# Patient Record
Sex: Female | Born: 1965 | Race: White | Hispanic: No | Marital: Married | Smoking: Never smoker
Health system: Southern US, Community
[De-identification: ages and names within clinical notes are randomized; demographics above are authoritative.]

## PROBLEM LIST (undated history)

## (undated) DIAGNOSIS — I499 Cardiac arrhythmia, unspecified: Secondary | ICD-10-CM

## (undated) DIAGNOSIS — Z9889 Other specified postprocedural states: Secondary | ICD-10-CM

## (undated) DIAGNOSIS — D352 Benign neoplasm of pituitary gland: Secondary | ICD-10-CM

## (undated) DIAGNOSIS — D649 Anemia, unspecified: Secondary | ICD-10-CM

## (undated) DIAGNOSIS — R112 Nausea with vomiting, unspecified: Secondary | ICD-10-CM

## (undated) HISTORY — DX: Benign neoplasm of pituitary gland: D35.2

## (undated) HISTORY — PX: ECTOPIC PREGNANCY SURGERY: SHX613

## (undated) HISTORY — PX: WISDOM TOOTH EXTRACTION: SHX21

---

## 1998-12-13 ENCOUNTER — Other Ambulatory Visit: Admission: RE | Admit: 1998-12-13 | Discharge: 1998-12-13 | Payer: Self-pay | Admitting: Obstetrics & Gynecology

## 2000-09-29 ENCOUNTER — Other Ambulatory Visit: Admission: RE | Admit: 2000-09-29 | Discharge: 2000-09-29 | Payer: Self-pay | Admitting: Obstetrics & Gynecology

## 2001-10-30 ENCOUNTER — Other Ambulatory Visit: Admission: RE | Admit: 2001-10-30 | Discharge: 2001-10-30 | Payer: Self-pay | Admitting: Obstetrics & Gynecology

## 2003-05-10 ENCOUNTER — Other Ambulatory Visit: Admission: RE | Admit: 2003-05-10 | Discharge: 2003-05-10 | Payer: Self-pay | Admitting: Obstetrics & Gynecology

## 2004-07-25 ENCOUNTER — Other Ambulatory Visit: Admission: RE | Admit: 2004-07-25 | Discharge: 2004-07-25 | Payer: Self-pay | Admitting: Obstetrics & Gynecology

## 2005-07-17 ENCOUNTER — Inpatient Hospital Stay (HOSPITAL_COMMUNITY): Admission: AD | Admit: 2005-07-17 | Discharge: 2005-07-19 | Payer: Self-pay | Admitting: Obstetrics & Gynecology

## 2005-07-18 ENCOUNTER — Encounter (INDEPENDENT_AMBULATORY_CARE_PROVIDER_SITE_OTHER): Payer: Self-pay | Admitting: *Deleted

## 2005-08-27 ENCOUNTER — Other Ambulatory Visit: Admission: RE | Admit: 2005-08-27 | Discharge: 2005-08-27 | Payer: Self-pay | Admitting: Obstetrics & Gynecology

## 2006-09-13 ENCOUNTER — Emergency Department (HOSPITAL_COMMUNITY): Admission: EM | Admit: 2006-09-13 | Discharge: 2006-09-13 | Payer: Self-pay | Admitting: Emergency Medicine

## 2011-02-01 NOTE — Discharge Summary (Signed)
Kim, Guerra               ACCOUNT NO.:  [id number]   MEDICAL RECORD NO.:  [id number]          PATIENT TYPE:  INP   LOCATION:  9318                          FACILITY:  [facility]   PHYSICIAN:  Freddy Finner, M.D.   DATE OF BIRTH:  Apr 06, 1966   DATE OF ADMISSION:  07/17/2005  DATE OF DISCHARGE:  07/19/2005                                 DISCHARGE SUMMARY   DISCHARGE DIAGNOSES:  1.  Right tubal pregnancy.  2.  Postoperative urinary retention.   OPERATIVE PROCEDURE:  Laparoscopy with right salpingectomy. Postoperative  complications none other than urinary retention.   DISPOSITION:  The patient was in satisfactory condition at the time of  discharge. She was voiding with 75 cc or less residual volume at the time of  her discharge. On the first postoperative day. She is discharged home with  Vicodin for postoperative pain; for follow-up in the office in 2 weeks or to  return with recurrence of urinary retention symptoms. She is to take a  regular diet. She is to have progressively increasing physical activity.   The patient presented at approximately 10 p.m. on July 17, 2005.  Her  clinical findings and ultrasound findings and findings all pointed to  ectopic pregnancy with rupture.   She was taken to the operating room where a right salpingectomy and  evacuation of blood from the abdomen was carried out. The procedure was  without complication. Later on the morning of the surgery her clinical  situation was stable. She was afebrile. Her hemoglobin was 10.2. Her abdomen  was soft and nontender but she was having difficulty or inability to empty  her bladder. For that reason she was given a Foley catheter and rested  overnight. By the next day she was able to void with minimal residual, her  condition was satisfactory and she was discharged home with disposition as  noted above.      Freddy Finner, M.D.  Electronically Signed     WRN/MEDQ  D:  08/29/2005  T:   08/30/2005  Job:  [id number]

## 2011-02-01 NOTE — Op Note (Signed)
Kim Guerra, Kim Guerra               ACCOUNT NO.:  [id number]   MEDICAL RECORD NO.:  [id number]          PATIENT TYPE:  INP   LOCATION:  9318                          FACILITY:  [facility]   PHYSICIAN:  Freddy Finner, M.D.   DATE OF BIRTH:  March 31, 1966   DATE OF PROCEDURE:  07/18/2005  DATE OF DISCHARGE:                                 OPERATIVE REPORT   PREOPERATIVE DIAGNOSIS:  Ectopic pregnancy.   POSTOPERATIVE DIAGNOSIS:  Right ampullary tubal pregnancy.   OPERATIVE PROCEDURE:  1.  Laparoscopy.  2.  Partial right salpingectomy.   SURGEON:  Dr. Jennette Kettle   ANESTHESIA:  General.   ESTIMATED INTRAOPERATIVE BLOOD LOSS:  Including blood in the abdomen at the  time of procedure 150 mL.   INTRAOPERATIVE COMPLICATIONS:  None.   The patient is a 45 year old with 2 living children, who had an unusual  bleeding pattern over the last month.  This was characterized by  approximately 8-10 days of bleeding starting on October 8 and again on  October 19.  She was seen in the office approximately 2 days prior to this  admission, at which time it was felt that she was having an ovulatory  bleeding.  Her symptoms were specific for the bleeding and generalized mild  lower abdominal pain.  November 1, in the evening prior to the surgery, she  [facility]ed [facility] because of increasing pain of her  abdomen.  She was subsequently referred to [facility] where evaluation  included quantitative hCG 497 and a pelvic ultrasound suggestive of ectopic  pregnancy.   She was brought to the operating room and there placed under adequate  general endotracheal anesthesia, placed in the dorsal lithotomy position  using the [place] stirrup system.  Betadine prep of abdomen, perineum, and  vagina was carried out in the standard fashion.  The bladder was evacuated  using sterile technique.  Cervix was visualized and Hulka tenaculum attached  without difficulty.  Sterile drapes were applied.  Two small  incisions were  then made in the abdomen, one at the umbilicus and one just above the  symphysis.  Through the umbilical incision, an 11 mm nonbladed disposable  trocar was introduced, and direct inspection revealed adequate placement  with no evidence of injury on entering.  Pneumoperitoneum was allowed to  accumulate with carbon dioxide gas.  A 5 mm trocar was placed through the  lower incision.  Systematic examination of the pelvic and abdominal contents  was carried out.  There was approximately 100-150 mL of blood in the pelvis.  There was a large clot extruding from the ampullary portion of the right  fallopian tube.  The left fallopian tube and ovary were normal.  The right  fallopian tube except for the dilatation distally appeared to be normal as  did the right ovary.  The uterus itself was normal.  The [place] irrigation  system was then used to clear some of the clots, tube expressed, and the  clot milked out of the distal ampullary end of the fallopian tube.  The tube  was still edematous  and swollen, and it was elected to proceed with partial  salpingectomy, and the Gyrus tripolar device was then used progressively  seal and divide pedicles to remove the distal half of the right fallopian  tube.  It was retrieved through the operating channel of the laparoscope and  is submitted for histologic examination.  Copious irrigation was then  carried out using the Ne[place]hat system, and all but 1 small clot were removed  from the abdomen.  Hemostasis was complete under irrigation and under  reduced intra-abdominal pressure.  The procedure was terminated.  Gas was  allowed to escape from the abdomen after all the irrigating solution was  removed.  The skin incisions were closed with interrupted subcuticular  sutures of 3-0 Dexon.  Steri-Strips were applied to the lower incision.  Marcaine 0.25% plain was injected into the incision sites for postoperative  analgesia.  The Hulka tenaculum was  removed.  The patient was awakened and  taken to recovery in good condition.      Freddy Finner, M.D.  Electronically Signed     WRN/MEDQ  D:  07/18/2005  T:  07/18/2005  Job:  [id number]

## 2014-10-06 ENCOUNTER — Other Ambulatory Visit: Payer: Self-pay | Admitting: Endocrinology

## 2014-10-06 DIAGNOSIS — E220 Acromegaly and pituitary gigantism: Secondary | ICD-10-CM

## 2014-10-17 ENCOUNTER — Ambulatory Visit
Admission: RE | Admit: 2014-10-17 | Discharge: 2014-10-17 | Disposition: A | Discharge status: Home / Self Care | Payer: PRIVATE HEALTH INSURANCE | Source: Ambulatory Visit | Attending: Endocrinology | Admitting: Endocrinology

## 2014-10-17 DIAGNOSIS — E220 Acromegaly and pituitary gigantism: Secondary | ICD-10-CM

## 2014-10-17 MED ORDER — GADOBENATE DIMEGLUMINE 529 MG/ML IV SOLN
9.0000 mL | Freq: Once | INTRAVENOUS | Status: AC | PRN
  Administered 2014-10-17: 9 mL via INTRAVENOUS

## 2014-11-04 ENCOUNTER — Encounter: Payer: Self-pay | Admitting: Cardiology

## 2014-11-04 ENCOUNTER — Ambulatory Visit (INDEPENDENT_AMBULATORY_CARE_PROVIDER_SITE_OTHER): Payer: PRIVATE HEALTH INSURANCE | Admitting: Cardiology

## 2014-11-04 VITALS — BP 162/110 | HR 98 | Ht 61.0 in | Wt 173.0 lb

## 2014-11-04 DIAGNOSIS — Z01812 Encounter for preprocedural laboratory examination: Secondary | ICD-10-CM

## 2014-11-04 DIAGNOSIS — R0600 Dyspnea, unspecified: Secondary | ICD-10-CM

## 2014-11-04 DIAGNOSIS — R9431 Abnormal electrocardiogram [ECG] [EKG]: Secondary | ICD-10-CM | POA: Insufficient documentation

## 2014-11-04 DIAGNOSIS — I10 Essential (primary) hypertension: Secondary | ICD-10-CM | POA: Insufficient documentation

## 2014-11-04 NOTE — Patient Instructions (Addendum)
Your physician recommends that you schedule a follow-up appointment in: as needed with Dr. Percival Spanish  We are going to order an echo, the schedulers will call you next week with a date and time to get that done

## 2014-11-04 NOTE — Progress Notes (Signed)
Cardiology Office Note   Date:  11/04/2014   ID:  Kim Guerra, DOB August 06, 1966, MRN [id number]  PCP:  No primary care provider on file.  Cardiologist:   Minus Breeding, MD   Chief Complaint  Patient presents with  . Pre-op Exam      History of Present Illness: Kim Guerra is a 49 y.o. female who presents for presents for preoperative evaluation. She has no past cardiac history. 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 She has apparently had some change in her facial appearance consistent with acromegaly. She's not had any cardiac issues in the past. However, given the association with pituitary hypersecretion and potential for cardiac issues such as cardiomyopathy she presents preoperatively. She's not been particularly active recently though she did exercise in the past. She still does some housework and climb stairs. She's not noticed any new symptoms such as  PND or orthopnea. She's not had any new palpitations, presyncope or syncope. She denies any chest pressure, neck or arm discomfort. She does have a significantly elevated blood pressure today and this is somewhat unusual.  She does get some dyspnea with exertion.   Past Medical History  Diagnosis Date  . Pituitary macroadenoma     Past Surgical History  Procedure Laterality Date  . Ectopic pregnancy surgery    . Wisdom tooth extraction       Current Outpatient Prescriptions  Medication Sig Dispense Refill  . ALPRAZolam (XANAX) 0.25 MG tablet Take 0.25 mg by mouth 2 (two) times daily as needed for anxiety.     No current facility-administered medications for this visit.    Allergies:   Review of patient's allergies indicates not on file.    Social History:  The patient  reports that she has never smoked. She does not have any smokeless tobacco history on file.   Family History:  The patient's family history includes Lung cancer (age of onset: 60) in her  brother; Lung cancer (age of onset: 61) in her father; Sarcoidosis in her sister.    ROS:  Please see the history of present illness.   Otherwise, review of systems are positive for none.   All other systems are reviewed and negative.    PHYSICAL EXAM: VS:  BP 162/110 mmHg  Pulse 98  Ht 5\' 1"  (1.549 m)  Wt 173 lb (78.472 kg)  BMI 32.70 kg/m2  LMP 10/15/2014 , BMI Body mass index is 32.7 kg/(m^2). GENERAL:  Well appearing HEENT:  Pupils equal round and reactive, fundi not visualized, oral mucosa unremarkable NECK:  No jugular venous distention, waveform within normal limits, carotid upstroke brisk and symmetric, no bruits, no thyromegaly LYMPHATICS:  No cervical, inguinal adenopathy LUNGS:  Clear to auscultation bilaterally BACK:  No CVA tenderness CHEST:  Unremarkable HEART:  PMI not displaced or sustained,S1 and S2 within normal limits, no S3, no S4, no clicks, no rubs, no murmurs ABD:  Flat, positive bowel sounds normal in frequency in pitch, no bruits, no rebound, no guarding, no midline pulsatile mass, no hepatomegaly, no splenomegaly EXT:  2 plus pulses throughout, no edema, no cyanosis no clubbing SKIN:  No rashes no nodules NEURO:  Cranial nerves II through XII grossly intact, motor grossly intact throughout PSYCH:  Cognitively intact, oriented to person place and time    EKG:  EKG is ordered today. The ekg ordered today demonstrates sinus rhythm, rightward axis, low voltage in the limb leads, poor anterior R wave  progression, no acute ST-T wave changes.     Wt Readings from Last 3 Encounters:  11/04/14 173 lb (78.472 kg)      Other studies Reviewed: Additional studies/ records that were reviewed today include: none.   ASSESSMENT AND PLAN:  ABNORMAL EKG:  The patient does have an abnormal EKG with some dyspnea. I will schedule an echocardiogram. Provided this is normal, no further cardiovascular testing would be indicated.  HTN:  Her blood pressure is quite  elevated.  However, she doesn't have any history of this   Her sister is a Marine scientist and she will keep her blood pressure over the weekend and let me know on Monday if it is elevated. I might start a low dose of chlorthalidone for instance if her blood pressure runs high and she is not in this office. She admits to being very anxious today.    Current medicines are reviewed at length with the patient today.  The patient does not have concerns regarding medicines.  The following changes have been made:  no change  Labs/ tests ordered today include: echo  Orders Placed This Encounter  Procedures  . 2D Echocardiogram without contrast     Disposition:   FU with as needed    Signed, Minus Breeding, MD  11/04/2014 5:33 PM    [place]

## 2014-11-10 NOTE — Addendum Note (Signed)
Addended by: Vear Clock on: 11/10/2014 01:56 PM   Modules accepted: Orders

## 2014-11-15 ENCOUNTER — Ambulatory Visit (HOSPITAL_COMMUNITY)
Admission: RE | Admit: 2014-11-15 | Discharge: 2014-11-15 | Disposition: A | Discharge status: Home / Self Care | Payer: PRIVATE HEALTH INSURANCE | Source: Ambulatory Visit | Attending: Cardiology | Admitting: Cardiology

## 2014-11-15 DIAGNOSIS — R9431 Abnormal electrocardiogram [ECG] [EKG]: Secondary | ICD-10-CM

## 2014-11-15 DIAGNOSIS — Z01818 Encounter for other preprocedural examination: Secondary | ICD-10-CM | POA: Insufficient documentation

## 2014-11-15 DIAGNOSIS — Z01812 Encounter for preprocedural laboratory examination: Secondary | ICD-10-CM

## 2014-11-15 NOTE — Progress Notes (Signed)
2D Echo Performed 11/15/2014    Marygrace Drought, RCS

## 2014-11-21 ENCOUNTER — Other Ambulatory Visit: Payer: Self-pay | Admitting: Obstetrics and Gynecology

## 2014-11-22 ENCOUNTER — Other Ambulatory Visit (HOSPITAL_COMMUNITY): Payer: Self-pay | Admitting: Neurosurgery

## 2014-11-22 LAB — CYTOLOGY - PAP

## 2014-11-23 ENCOUNTER — Telehealth: Payer: Self-pay | Admitting: Cardiology

## 2014-11-23 ENCOUNTER — Encounter: Payer: Self-pay | Admitting: *Deleted

## 2014-11-23 NOTE — Telephone Encounter (Signed)
Clearance note sent and pt. called

## 2014-11-23 NOTE — Telephone Encounter (Signed)
Pt says her doctor have not received clarence for her surgery. She was here on 11-15-14 to have an ultrasound. She need this clarence asap.

## 2014-11-25 ENCOUNTER — Other Ambulatory Visit (INDEPENDENT_AMBULATORY_CARE_PROVIDER_SITE_OTHER): Payer: Self-pay | Admitting: Otolaryngology

## 2014-11-25 DIAGNOSIS — D497 Neoplasm of unspecified behavior of endocrine glands and other parts of nervous system: Secondary | ICD-10-CM

## 2014-11-30 ENCOUNTER — Ambulatory Visit
Admission: RE | Admit: 2014-11-30 | Discharge: 2014-11-30 | Disposition: A | Discharge status: Home / Self Care | Payer: PRIVATE HEALTH INSURANCE | Source: Ambulatory Visit | Attending: Otolaryngology | Admitting: Otolaryngology

## 2014-11-30 DIAGNOSIS — D497 Neoplasm of unspecified behavior of endocrine glands and other parts of nervous system: Secondary | ICD-10-CM

## 2014-12-19 NOTE — Pre-Procedure Instructions (Signed)
Najmah Carradine  12/19/2014   Your procedure is scheduled on:  Thursday, April 14.  Report to [facility] Admitting at 5:30 AM.   Call this number if you have problems the morning of surgery: [phone]                For any other questions, please call [phone], Monday - Friday 8 AM - 4 PM.   Remember:   Do not eat food or drink liquids after midnight Wednesday, April 13.   Take these medicines the morning of surgery with A SIP OF WATER: Take if needed:acetaminophen (TYLENOL), ALPRAZolam Duanne Moron).    Do not wear jewelry, make-up or nail polish.  Do not wear lotions, powders, or perfumes.   Do not shave 48 hours prior to surgery.  Do not bring valuables to the hospital.              [facility] is not responsible  for any belongings or valuables.               Contacts, dentures or bridgework may not be worn into surgery.  Leave suitcase in the car. After surgery it may be brought to your room.  For patients admitted to the hospital, discharge time is determined by your  treatment team.               Special Instructions: Review  [facility] - Preparing For Surgery.   Please read over the following fact sheets that you were given: Pain Booklet, Coughing and Deep Breathing, Blood Transfusion Information and Surgical Site Infection Prevention

## 2014-12-20 ENCOUNTER — Encounter (HOSPITAL_COMMUNITY): Payer: Self-pay

## 2014-12-20 ENCOUNTER — Encounter (HOSPITAL_COMMUNITY)
Admission: RE | Admit: 2014-12-20 | Discharge: 2014-12-20 | Disposition: A | Discharge status: Home / Self Care | Payer: PRIVATE HEALTH INSURANCE | Source: Ambulatory Visit | Attending: Neurosurgery | Admitting: Neurosurgery

## 2014-12-20 DIAGNOSIS — Z01812 Encounter for preprocedural laboratory examination: Secondary | ICD-10-CM | POA: Insufficient documentation

## 2014-12-20 HISTORY — DX: Nausea with vomiting, unspecified: R11.2

## 2014-12-20 HISTORY — DX: Anemia, unspecified: D64.9

## 2014-12-20 HISTORY — DX: Other specified postprocedural states: Z98.890

## 2014-12-20 HISTORY — DX: Cardiac arrhythmia, unspecified: I49.9

## 2014-12-20 LAB — ABO/RH: ABO/RH(D): O NEG

## 2014-12-20 LAB — BASIC METABOLIC PANEL
ANION GAP: 3 — AB (ref 5–15)
BUN: 11 mg/dL (ref 6–23)
CALCIUM: 9.5 mg/dL (ref 8.4–10.5)
CO2: 31 mmol/L (ref 19–32)
Chloride: 106 mmol/L (ref 96–112)
Creatinine, Ser: 0.61 mg/dL (ref 0.50–1.10)
GFR calc Af Amer: >90 mL/min (ref >90)
GLUCOSE: 110 mg/dL — AB (ref 70–99)
Potassium: 3.4 mmol/L — ABNORMAL LOW (ref 3.5–5.1)
Sodium: 140 mmol/L (ref 135–145)

## 2014-12-20 LAB — CBC
HCT: 37.2 % (ref 36.0–46.0)
Hemoglobin: 11.9 g/dL — ABNORMAL LOW (ref 12.0–15.0)
MCH: 26.1 pg (ref 26.0–34.0)
MCHC: 32 g/dL (ref 30.0–36.0)
MCV: 81.6 fL (ref 78.0–100.0)
Platelets: 311 10*3/uL (ref 150–400)
RBC: 4.56 MIL/uL (ref 3.87–5.11)
RDW: 13.3 % (ref 11.5–15.5)
WBC: 6.8 10*3/uL (ref 4.0–10.5)

## 2014-12-20 LAB — TYPE AND SCREEN
ABO/RH(D): O NEG
Antibody Screen: NEGATIVE

## 2014-12-20 LAB — HCG, SERUM, QUALITATIVE: Preg, Serum: NEGATIVE

## 2014-12-20 NOTE — Progress Notes (Addendum)
PCP is Dr. Maxwell Caul at [facility]. Cardioligist is Dr Percival Spanish. Echo noted in epic from 11-15-14. Denies ever having a card cath or stress test. Denies a recent CXR. EKG noted in epic from 11-09-14 Pt states that in recovery after her ectopic preg surgery, she heard the nurses say to make a note in her chart that she had problems with the tubes.  She is not sure if they were even talking about her,but she wanted to let us know. (this surgery took place in 2008)

## 2014-12-22 NOTE — Progress Notes (Signed)
Anesthesia Chart Review:  Pt is 49 year old female scheduled for craniotomy, hypophysectomy, transnasal approach for pituitary tumor on 12/29/2014 with Dr. Sherwood Gambler.   PMH includes: dysrhythmia (PVCs), anemia, pituitary macroadenoma. Never smoker. BMI 31.   Preoperative labs reviewed.    EKG 11/04/2014: Sinus rhythm, rightward axis, low voltage in the limb leads, poor anterior R wave progression, no acute ST-T wave changes.   Echo 11/15/2014:  - Left ventricle: The cavity size was normal. Wall thickness was normal. Systolic function was vigorous. The estimated ejection fraction was in the range of 65% to 70%. Doppler parameters are consistent with abnormal left ventricular relaxation (grade 1 diastolic dysfunction).  Pt saw Dr. Percival Spanish for preoperative evaluation. Has cardiac clearance found in notes on echo report.   If no changes, I anticipate pt can proceed with surgery as scheduled.   Willeen Cass, FNP-BC [facility] Short Stay Surgical Center/Anesthesiology Phone: [phone] 12/22/2014 3:16 PM

## 2014-12-28 MED ORDER — CEFAZOLIN SODIUM-DEXTROSE 2-3 GM-% IV SOLR
2.0000 g | INTRAVENOUS | Status: DC
  Filled 2014-12-28: qty 50

## 2014-12-29 ENCOUNTER — Inpatient Hospital Stay (HOSPITAL_COMMUNITY): Payer: PRIVATE HEALTH INSURANCE | Admitting: Certified Registered Nurse Anesthetist

## 2014-12-29 ENCOUNTER — Encounter (HOSPITAL_COMMUNITY): Payer: Self-pay | Admitting: Surgery

## 2014-12-29 ENCOUNTER — Inpatient Hospital Stay (HOSPITAL_COMMUNITY): Payer: PRIVATE HEALTH INSURANCE

## 2014-12-29 ENCOUNTER — Inpatient Hospital Stay (HOSPITAL_COMMUNITY)
Admission: RE | Admit: 2014-12-29 | Discharge: 2015-01-01 | DRG: 624 | Disposition: A | Discharge status: Home / Self Care | Payer: PRIVATE HEALTH INSURANCE | Source: Ambulatory Visit | Attending: Neurosurgery | Admitting: Neurosurgery

## 2014-12-29 ENCOUNTER — Inpatient Hospital Stay (HOSPITAL_COMMUNITY): Payer: PRIVATE HEALTH INSURANCE | Admitting: Emergency Medicine

## 2014-12-29 ENCOUNTER — Encounter (HOSPITAL_COMMUNITY)
Admission: RE | Disposition: A | Discharge status: Home / Self Care | Payer: Self-pay | Source: Ambulatory Visit | Attending: Neurosurgery

## 2014-12-29 DIAGNOSIS — Z419 Encounter for procedure for purposes other than remedying health state, unspecified: Secondary | ICD-10-CM

## 2014-12-29 DIAGNOSIS — J310 Chronic rhinitis: Secondary | ICD-10-CM | POA: Diagnosis present

## 2014-12-29 DIAGNOSIS — Z881 Allergy status to other antibiotic agents status: Secondary | ICD-10-CM

## 2014-12-29 DIAGNOSIS — D352 Benign neoplasm of pituitary gland: Secondary | ICD-10-CM | POA: Diagnosis present

## 2014-12-29 DIAGNOSIS — I493 Ventricular premature depolarization: Secondary | ICD-10-CM | POA: Diagnosis present

## 2014-12-29 DIAGNOSIS — R0600 Dyspnea, unspecified: Secondary | ICD-10-CM | POA: Diagnosis present

## 2014-12-29 DIAGNOSIS — J342 Deviated nasal septum: Secondary | ICD-10-CM | POA: Diagnosis present

## 2014-12-29 DIAGNOSIS — D649 Anemia, unspecified: Secondary | ICD-10-CM | POA: Diagnosis present

## 2014-12-29 DIAGNOSIS — Z882 Allergy status to sulfonamides status: Secondary | ICD-10-CM | POA: Diagnosis not present

## 2014-12-29 DIAGNOSIS — I10 Essential (primary) hypertension: Secondary | ICD-10-CM | POA: Diagnosis present

## 2014-12-29 HISTORY — PX: CRANIOTOMY: SHX93

## 2014-12-29 HISTORY — PX: TRANSNASAL APPROACH: SHX6149

## 2014-12-29 LAB — BASIC METABOLIC PANEL
Anion gap: 6 (ref 5–15)
BUN: <5 mg/dL — ABNORMAL LOW (ref 6–23)
CO2: 20 mmol/L (ref 19–32)
Calcium: 7.7 mg/dL — ABNORMAL LOW (ref 8.4–10.5)
Chloride: 113 mmol/L — ABNORMAL HIGH (ref 96–112)
Creatinine, Ser: 0.48 mg/dL — ABNORMAL LOW (ref 0.50–1.10)
GFR calc Af Amer: >90 mL/min (ref >90)
GFR calc non Af Amer: >90 mL/min (ref >90)
Glucose, Bld: 164 mg/dL — ABNORMAL HIGH (ref 70–99)
Potassium: 3.7 mmol/L (ref 3.5–5.1)
Sodium: 139 mmol/L (ref 135–145)

## 2014-12-29 SURGERY — CRANIOTOMY HYPOPHYSECTOMY TRANSNASAL APPROACH
Anesthesia: General

## 2014-12-29 MED ORDER — BACITRACIN-NEOMYCIN-POLYMYXIN 400-5-5000 EX OINT
TOPICAL_OINTMENT | CUTANEOUS | Status: DC | PRN
  Administered 2014-12-29: 1 via TOPICAL

## 2014-12-29 MED ORDER — HYDROMORPHONE HCL 1 MG/ML IJ SOLN: 0.2500 mg | INTRAMUSCULAR | Status: DC | PRN

## 2014-12-29 MED ORDER — WHITE PETROLATUM GEL
Status: AC
  Administered 2014-12-29: 0.2
  Filled 2014-12-29: qty 1

## 2014-12-29 MED ORDER — BUPIVACAINE HCL 0.5 % IJ SOLN
INTRAMUSCULAR | Status: DC | PRN
  Administered 2014-12-29: 10 mL

## 2014-12-29 MED ORDER — ONDANSETRON HCL 4 MG/2ML IJ SOLN
INTRAMUSCULAR | Status: AC
  Filled 2014-12-29: qty 2

## 2014-12-29 MED ORDER — ESMOLOL HCL 10 MG/ML IV SOLN
INTRAVENOUS | Status: DC | PRN
  Administered 2014-12-29 (×5): 20 mg via INTRAVENOUS

## 2014-12-29 MED ORDER — POTASSIUM CHLORIDE 2 MEQ/ML IV SOLN
INTRAVENOUS | Status: AC
  Administered 2014-12-29: 100 mL/h via INTRAVENOUS
  Filled 2014-12-29: qty 1000

## 2014-12-29 MED ORDER — FENTANYL CITRATE 0.05 MG/ML IJ SOLN
INTRAMUSCULAR | Status: AC
  Filled 2014-12-29: qty 5

## 2014-12-29 MED ORDER — BACITRACIN 50000 UNITS IM SOLR
INTRAMUSCULAR | Status: DC | PRN
  Administered 2014-12-29: 10:00:00

## 2014-12-29 MED ORDER — OXYMETAZOLINE HCL 0.05 % NA SOLN
NASAL | Status: DC | PRN
  Administered 2014-12-29 (×3): 1 via NASAL

## 2014-12-29 MED ORDER — LIDOCAINE HCL 4 % MT SOLN
OROMUCOSAL | Status: DC | PRN
  Administered 2014-12-29: 4 mL via TOPICAL

## 2014-12-29 MED ORDER — MAGNESIUM HYDROXIDE 400 MG/5ML PO SUSP: 30.0000 mL | Freq: Every day | ORAL | Status: DC | PRN

## 2014-12-29 MED ORDER — PANTOPRAZOLE SODIUM 40 MG IV SOLR
40.0000 mg | Freq: Every day | INTRAVENOUS | Status: DC
  Administered 2014-12-29 – 2014-12-30 (×2): 40 mg via INTRAVENOUS
  Filled 2014-12-29 (×3): qty 40

## 2014-12-29 MED ORDER — SODIUM CHLORIDE 0.9 % IV SOLN
0.0125 ug/kg/min | INTRAVENOUS | Status: AC
  Administered 2014-12-29: .1 ug/kg/min via INTRAVENOUS
  Filled 2014-12-29: qty 2000

## 2014-12-29 MED ORDER — VECURONIUM BROMIDE 10 MG IV SOLR
INTRAVENOUS | Status: DC | PRN
  Administered 2014-12-29: 4 mg via INTRAVENOUS
  Administered 2014-12-29: 2 mg via INTRAVENOUS

## 2014-12-29 MED ORDER — LIDOCAINE-EPINEPHRINE 1 %-1:100000 IJ SOLN
INTRAMUSCULAR | Status: DC | PRN
  Administered 2014-12-29: 3 mL
  Administered 2014-12-29: 10 mL

## 2014-12-29 MED ORDER — SUCCINYLCHOLINE CHLORIDE 20 MG/ML IJ SOLN
INTRAMUSCULAR | Status: DC | PRN
  Administered 2014-12-29: 100 mg via INTRAVENOUS

## 2014-12-29 MED ORDER — FENTANYL CITRATE 0.05 MG/ML IJ SOLN
INTRAMUSCULAR | Status: DC | PRN
  Administered 2014-12-29: 50 ug via INTRAVENOUS
  Administered 2014-12-29: 150 ug via INTRAVENOUS
  Administered 2014-12-29: 100 ug via INTRAVENOUS
  Administered 2014-12-29: 50 ug via INTRAVENOUS
  Administered 2014-12-29 (×4): 100 ug via INTRAVENOUS

## 2014-12-29 MED ORDER — ARTIFICIAL TEARS OP OINT
TOPICAL_OINTMENT | OPHTHALMIC | Status: DC | PRN
  Administered 2014-12-29: 1 via OPHTHALMIC

## 2014-12-29 MED ORDER — LIDOCAINE HCL (CARDIAC) 20 MG/ML IV SOLN
INTRAVENOUS | Status: DC | PRN
  Administered 2014-12-29: 60 mg via INTRAVENOUS

## 2014-12-29 MED ORDER — SCOPOLAMINE 1 MG/3DAYS TD PT72
MEDICATED_PATCH | TRANSDERMAL | Status: DC | PRN
  Administered 2014-12-29: 1 via TRANSDERMAL

## 2014-12-29 MED ORDER — HYDROXYZINE HCL 50 MG PO TABS
50.0000 mg | ORAL_TABLET | ORAL | Status: DC | PRN
  Filled 2014-12-29: qty 1

## 2014-12-29 MED ORDER — VECURONIUM BROMIDE 10 MG IV SOLR
INTRAVENOUS | Status: AC
  Filled 2014-12-29: qty 10

## 2014-12-29 MED ORDER — THROMBIN 5000 UNITS EX SOLR
CUTANEOUS | Status: DC | PRN
  Administered 2014-12-29 (×2): 5000 [IU] via TOPICAL

## 2014-12-29 MED ORDER — ALPRAZOLAM 0.25 MG PO TABS: 0.2500 mg | ORAL_TABLET | Freq: Two times a day (BID) | ORAL | Status: DC | PRN

## 2014-12-29 MED ORDER — DEXTROSE 5 % IV SOLN
2.0000 g | INTRAVENOUS | Status: AC
  Administered 2014-12-29: 2 g via INTRAVENOUS
  Filled 2014-12-29: qty 2

## 2014-12-29 MED ORDER — PHENYLEPHRINE HCL 10 MG/ML IJ SOLN
INTRAMUSCULAR | Status: DC | PRN
  Administered 2014-12-29: 40 ug via INTRAVENOUS

## 2014-12-29 MED ORDER — HYDROCODONE-ACETAMINOPHEN 5-325 MG PO TABS
1.0000 | ORAL_TABLET | ORAL | Status: DC | PRN
  Administered 2015-01-01: 1 via ORAL
  Filled 2014-12-29: qty 1

## 2014-12-29 MED ORDER — SODIUM CHLORIDE 0.9 % IV SOLN
INTRAVENOUS | Status: DC | PRN
  Administered 2014-12-29: 07:00:00 via INTRAVENOUS

## 2014-12-29 MED ORDER — ONDANSETRON HCL 4 MG/2ML IJ SOLN
4.0000 mg | Freq: Four times a day (QID) | INTRAMUSCULAR | Status: DC | PRN
  Administered 2014-12-29: 4 mg via INTRAVENOUS
  Filled 2014-12-29: qty 2

## 2014-12-29 MED ORDER — OXYCODONE HCL 5 MG/5ML PO SOLN: 5.0000 mg | Freq: Once | ORAL | Status: DC | PRN

## 2014-12-29 MED ORDER — STERILE WATER FOR INJECTION IJ SOLN
INTRAMUSCULAR | Status: AC
  Filled 2014-12-29: qty 10

## 2014-12-29 MED ORDER — OXYCODONE HCL 5 MG PO TABS: 5.0000 mg | ORAL_TABLET | Freq: Once | ORAL | Status: DC | PRN

## 2014-12-29 MED ORDER — KCL IN DEXTROSE-NACL 30-5-0.45 MEQ/L-%-% IV SOLN
INTRAVENOUS | Status: DC
  Administered 2014-12-29: 15:00:00 via INTRAVENOUS
  Filled 2014-12-29 (×3): qty 1000

## 2014-12-29 MED ORDER — ROCURONIUM BROMIDE 100 MG/10ML IV SOLN
INTRAVENOUS | Status: DC | PRN
  Administered 2014-12-29: 50 mg via INTRAVENOUS

## 2014-12-29 MED ORDER — ONDANSETRON HCL 4 MG PO TABS: 4.0000 mg | ORAL_TABLET | Freq: Four times a day (QID) | ORAL | Status: DC | PRN

## 2014-12-29 MED ORDER — MIDAZOLAM HCL 2 MG/2ML IJ SOLN
INTRAMUSCULAR | Status: AC
  Filled 2014-12-29: qty 2

## 2014-12-29 MED ORDER — SUCCINYLCHOLINE CHLORIDE 20 MG/ML IJ SOLN
INTRAMUSCULAR | Status: AC
  Filled 2014-12-29: qty 1

## 2014-12-29 MED ORDER — MORPHINE SULFATE 2 MG/ML IJ SOLN
1.0000 mg | INTRAMUSCULAR | Status: DC | PRN
Start: 2014-12-29 — End: 2014-12-31
  Administered 2014-12-29: 1 mg via INTRAVENOUS
  Administered 2014-12-29 (×2): 2 mg via INTRAVENOUS
  Filled 2014-12-29 (×3): qty 1

## 2014-12-29 MED ORDER — HYDROXYZINE HCL 50 MG PO TABS: 50.0000 mg | ORAL_TABLET | ORAL | Status: DC | PRN

## 2014-12-29 MED ORDER — PROMETHAZINE HCL 25 MG/ML IJ SOLN: 6.2500 mg | INTRAMUSCULAR | Status: DC | PRN

## 2014-12-29 MED ORDER — HEMOSTATIC AGENTS (NO CHARGE) OPTIME
TOPICAL | Status: DC | PRN
  Administered 2014-12-29: 1 via TOPICAL

## 2014-12-29 MED ORDER — 0.9 % SODIUM CHLORIDE (POUR BTL) OPTIME
TOPICAL | Status: DC | PRN
  Administered 2014-12-29: 1000 mL

## 2014-12-29 MED ORDER — LABETALOL HCL 5 MG/ML IV SOLN
INTRAVENOUS | Status: AC
  Filled 2014-12-29: qty 4

## 2014-12-29 MED ORDER — LABETALOL HCL 5 MG/ML IV SOLN
5.0000 mg | INTRAVENOUS | Status: DC | PRN
  Administered 2014-12-29 (×3): 5 mg via INTRAVENOUS

## 2014-12-29 MED ORDER — ARTIFICIAL TEARS OP OINT
TOPICAL_OINTMENT | OPHTHALMIC | Status: AC
Start: 2014-12-29 — End: 2014-12-29
  Filled 2014-12-29: qty 3.5

## 2014-12-29 MED ORDER — LIDOCAINE HCL (CARDIAC) 20 MG/ML IV SOLN
INTRAVENOUS | Status: AC
  Filled 2014-12-29: qty 10

## 2014-12-29 MED ORDER — POTASSIUM CHLORIDE 2 MEQ/ML IV SOLN
INTRAVENOUS | Status: DC
  Administered 2014-12-29 – 2014-12-30 (×2): via INTRAVENOUS
  Filled 2014-12-29 (×3): qty 1000

## 2014-12-29 MED ORDER — HYDROXYZINE HCL 50 MG/ML IM SOLN
50.0000 mg | INTRAMUSCULAR | Status: DC | PRN
  Filled 2014-12-29: qty 1

## 2014-12-29 MED ORDER — PROPOFOL 10 MG/ML IV BOLUS
INTRAVENOUS | Status: AC
  Filled 2014-12-29: qty 20

## 2014-12-29 MED ORDER — NEOSTIGMINE METHYLSULFATE 10 MG/10ML IV SOLN
INTRAVENOUS | Status: DC | PRN
  Administered 2014-12-29: 4 mg via INTRAVENOUS

## 2014-12-29 MED ORDER — MIDAZOLAM HCL 5 MG/5ML IJ SOLN
INTRAMUSCULAR | Status: DC | PRN
  Administered 2014-12-29 (×2): 1 mg via INTRAVENOUS

## 2014-12-29 MED ORDER — HYDRALAZINE HCL 20 MG/ML IJ SOLN: 5.0000 mg | INTRAMUSCULAR | Status: DC | PRN

## 2014-12-29 MED ORDER — SCOPOLAMINE 1 MG/3DAYS TD PT72
MEDICATED_PATCH | TRANSDERMAL | Status: AC
  Filled 2014-12-29: qty 1

## 2014-12-29 MED ORDER — PROPOFOL 10 MG/ML IV BOLUS
INTRAVENOUS | Status: DC | PRN
  Administered 2014-12-29: 50 mg via INTRAVENOUS
  Administered 2014-12-29: 20 mg via INTRAVENOUS
  Administered 2014-12-29 (×2): 50 mg via INTRAVENOUS
  Administered 2014-12-29 (×2): 20 mg via INTRAVENOUS
  Administered 2014-12-29: 150 mg via INTRAVENOUS
  Administered 2014-12-29: 20 mg via INTRAVENOUS
  Administered 2014-12-29 (×2): 50 mg via INTRAVENOUS

## 2014-12-29 MED ORDER — BISACODYL 10 MG RE SUPP
10.0000 mg | Freq: Every day | RECTAL | Status: DC | PRN
Start: 2014-12-29 — End: 2015-01-01

## 2014-12-29 MED ORDER — SODIUM CHLORIDE 0.9 % IV SOLN
INTRAVENOUS | Status: DC | PRN
  Administered 2014-12-29 (×2): via INTRAVENOUS

## 2014-12-29 MED ORDER — ROCURONIUM BROMIDE 50 MG/5ML IV SOLN
INTRAVENOUS | Status: AC
  Filled 2014-12-29: qty 1

## 2014-12-29 MED ORDER — ESMOLOL HCL 10 MG/ML IV SOLN
INTRAVENOUS | Status: AC
  Filled 2014-12-29: qty 10

## 2014-12-29 MED ORDER — ONDANSETRON HCL 4 MG/2ML IJ SOLN
INTRAMUSCULAR | Status: DC | PRN
  Administered 2014-12-29 (×2): 4 mg via INTRAVENOUS

## 2014-12-29 MED ORDER — GLYCOPYRROLATE 0.2 MG/ML IJ SOLN
INTRAMUSCULAR | Status: DC | PRN
  Administered 2014-12-29: 0.6 mg via INTRAVENOUS

## 2014-12-29 MED ORDER — CEFTRIAXONE SODIUM IN DEXTROSE 20 MG/ML IV SOLN
1.0000 g | INTRAVENOUS | Status: DC
  Administered 2014-12-30 – 2015-01-01 (×3): 1 g via INTRAVENOUS
  Filled 2014-12-29 (×3): qty 50

## 2014-12-29 SURGICAL SUPPLY — 127 items
ANTI FOG SOLUTION ×2 IMPLANT
ATTRACTOMAT 16X20 MAGNETIC DRP (DRAPES) IMPLANT
BAG DECANTER FOR FLEXI CONT (MISCELLANEOUS) IMPLANT
BENZOIN TINCTURE PRP APPL 2/3 (GAUZE/BANDAGES/DRESSINGS) IMPLANT
BLADE EYE SICKLE 84 5 BEAV (BLADE) IMPLANT
BLADE ROTATE RAD 40 4 M4 (BLADE) IMPLANT
BLADE ROTATE TRICUT 4X13 M4 (BLADE) ×2 IMPLANT
BLADE SURG 10 STRL SS (BLADE) ×2 IMPLANT
BLADE SURG 11 STRL SS (BLADE) ×4 IMPLANT
BLADE SURG 15 STRL LF DISP TIS (BLADE) ×1 IMPLANT
BLADE SURG 15 STRL SS (BLADE) ×1
BUR NEURO DIAMOND 3X3.8 (BURR) IMPLANT
BURR NEURO DIAMOND 3X3.8 (BURR)
CANISTER SUCT 3000ML PPV (MISCELLANEOUS) ×6 IMPLANT
CATH ROBINSON RED A/P 10FR (CATHETERS) ×2 IMPLANT
CATH ROBINSON RED A/P 14FR (CATHETERS) IMPLANT
COAGULATOR SUCT SWTCH 10FR 6 (ELECTROSURGICAL) IMPLANT
CONT SPEC 4OZ CLIKSEAL STRL BL (MISCELLANEOUS) ×6 IMPLANT
CORDS BIPOLAR (ELECTRODE) ×2 IMPLANT
COTTONBALL LRG STERILE PKG (GAUZE/BANDAGES/DRESSINGS) ×2 IMPLANT
DECANTER SPIKE VIAL GLASS SM (MISCELLANEOUS) IMPLANT
DEPRESSOR TONGUE BLADE STERILE (MISCELLANEOUS) ×4 IMPLANT
DERMABOND ADVANCED (GAUZE/BANDAGES/DRESSINGS) ×1
DERMABOND ADVANCED .7 DNX12 (GAUZE/BANDAGES/DRESSINGS) ×1 IMPLANT
DRAIN SUBARACHNOID (WOUND CARE) IMPLANT
DRAPE C-ARM 42X72 X-RAY (DRAPES) ×4 IMPLANT
DRAPE EENT ADH APERT 15X15 STR (DRAPES) IMPLANT
DRAPE INCISE IOBAN 66X45 STRL (DRAPES) ×2 IMPLANT
DRAPE MICROSCOPE LEICA (MISCELLANEOUS) ×2 IMPLANT
DRAPE POUCH INSTRU U-SHP 10X18 (DRAPES) ×2 IMPLANT
DRAPE PROXIMA HALF (DRAPES) ×6 IMPLANT
DRESSING NASAL KENNEDY 3.5X.9 (MISCELLANEOUS) ×1 IMPLANT
DRESSING NASAL POPE 10X1.5X2.5 (GAUZE/BANDAGES/DRESSINGS) ×4 IMPLANT
DRSG NASAL KENNEDY 3.5X.9 (MISCELLANEOUS) ×2
DRSG NASAL POPE 10X1.5X2.5 (GAUZE/BANDAGES/DRESSINGS) ×8
DRSG TELFA 3X8 NADH (GAUZE/BANDAGES/DRESSINGS) IMPLANT
DURAPREP 26ML APPLICATOR (WOUND CARE) ×2 IMPLANT
ELECT CAUTERY BLADE 6.4 (BLADE) ×2 IMPLANT
ELECT COATED BLADE 2.86 ST (ELECTRODE) ×2 IMPLANT
ELECT NEEDLE TIP 2.8 STRL (NEEDLE) ×2 IMPLANT
ELECT REM PT RETURN 9FT ADLT (ELECTROSURGICAL) ×4
ELECTRODE REM PT RTRN 9FT ADLT (ELECTROSURGICAL) ×2 IMPLANT
FILTER ARTHROSCOPY CONVERTOR (FILTER) IMPLANT
GAUZE PACKING FOLDED 1IN STRL (GAUZE/BANDAGES/DRESSINGS) ×2 IMPLANT
GAUZE PACKING FOLDED 2  STR (GAUZE/BANDAGES/DRESSINGS) ×1
GAUZE PACKING FOLDED 2 STR (GAUZE/BANDAGES/DRESSINGS) ×1 IMPLANT
GAUZE SPONGE 2X2 8PLY STRL LF (GAUZE/BANDAGES/DRESSINGS) ×1 IMPLANT
GAUZE SPONGE 4X4 12PLY STRL (GAUZE/BANDAGES/DRESSINGS) ×2 IMPLANT
GLOVE BIOGEL M 7.0 STRL (GLOVE) ×4 IMPLANT
GLOVE BIOGEL PI IND STRL 8 (GLOVE) ×2 IMPLANT
GLOVE BIOGEL PI INDICATOR 8 (GLOVE) ×2
GLOVE ECLIPSE 7.5 STRL STRAW (GLOVE) ×6 IMPLANT
GLOVE EXAM NITRILE LRG STRL (GLOVE) IMPLANT
GLOVE EXAM NITRILE MD LF STRL (GLOVE) IMPLANT
GLOVE EXAM NITRILE XL STR (GLOVE) IMPLANT
GLOVE EXAM NITRILE XS STR PU (GLOVE) IMPLANT
GLOVE INDICATOR 8.0 STRL GRN (GLOVE) ×4 IMPLANT
GLOVE SURG SS PI 6.5 STRL IVOR (GLOVE) ×4 IMPLANT
GOWN STRL NON-REIN LRG LVL3 (GOWN DISPOSABLE) IMPLANT
GOWN STRL REUS W/ TWL LRG LVL3 (GOWN DISPOSABLE) ×2 IMPLANT
GOWN STRL REUS W/ TWL XL LVL3 (GOWN DISPOSABLE) ×1 IMPLANT
GOWN STRL REUS W/TWL 2XL LVL3 (GOWN DISPOSABLE) ×2 IMPLANT
GOWN STRL REUS W/TWL LRG LVL3 (GOWN DISPOSABLE) ×2
GOWN STRL REUS W/TWL XL LVL3 (GOWN DISPOSABLE) ×1
HEMOSTAT SURGICEL 2X14 (HEMOSTASIS) ×2 IMPLANT
INSTRUMENTR TRACKERV ×2 IMPLANT
IODOFORM PACKING STRIP ×2 IMPLANT
KIT BASIN OR (CUSTOM PROCEDURE TRAY) ×2 IMPLANT
KIT ROOM TURNOVER OR (KITS) ×4 IMPLANT
MARKER SKIN DUAL TIP RULER LAB (MISCELLANEOUS) ×4 IMPLANT
MEROCEL 3.5X0.9CMX1,2 CM ×2 IMPLANT
NEEDLE 18GX1X1/2 (RX/OR ONLY) (NEEDLE) IMPLANT
NEEDLE HYPO 25X1 1.5 SAFETY (NEEDLE) ×2 IMPLANT
NEEDLE SPNL 22GX3.5 QUINCKE BK (NEEDLE) ×2 IMPLANT
NS IRRIG 1000ML POUR BTL (IV SOLUTION) ×4 IMPLANT
PAD ARMBOARD 7.5X6 YLW CONV (MISCELLANEOUS) ×6 IMPLANT
PAD ENT ADHESIVE 25PK (MISCELLANEOUS) ×2 IMPLANT
PATTIES SURGICAL .25X.25 (GAUZE/BANDAGES/DRESSINGS) IMPLANT
PATTIES SURGICAL .5 X.5 (GAUZE/BANDAGES/DRESSINGS) ×2 IMPLANT
PATTIES SURGICAL .5 X3 (DISPOSABLE) ×6 IMPLANT
PENCIL BUTTON HOLSTER BLD 10FT (ELECTRODE) ×2 IMPLANT
RUBBERBAND STERILE (MISCELLANEOUS) ×4 IMPLANT
SHEET SIL 040 (INSTRUMENTS) IMPLANT
SPECIMEN JAR SMALL (MISCELLANEOUS) ×4 IMPLANT
SPONGE GAUZE 2X2 STER 10/PKG (GAUZE/BANDAGES/DRESSINGS) ×1
SPONGE LAP 4X18 X RAY DECT (DISPOSABLE) ×2 IMPLANT
SPONGE NEURO XRAY DETECT 1X3 (DISPOSABLE) IMPLANT
SPONGE SURGIFOAM ABS GEL 100 (HEMOSTASIS) IMPLANT
SPONGE SURGIFOAM ABS GEL 12-7 (HEMOSTASIS) IMPLANT
STAPLER SKIN PROX WIDE 3.9 (STAPLE) ×2 IMPLANT
STRIP CLOSURE SKIN 1/2X4 (GAUZE/BANDAGES/DRESSINGS) IMPLANT
STRIP CLOSURE SKIN 1/4X4 (GAUZE/BANDAGES/DRESSINGS) IMPLANT
SUT BONE WAX W31G (SUTURE) ×2 IMPLANT
SUT CHROMIC 3 0 PS 2 (SUTURE) IMPLANT
SUT CHROMIC 4 0 P 3 18 (SUTURE) ×4 IMPLANT
SUT ETHILON 3 0 FSL (SUTURE) IMPLANT
SUT ETHILON 3 0 PS 1 (SUTURE) IMPLANT
SUT ETHILON 4 0 PS 2 18 (SUTURE) ×2 IMPLANT
SUT ETHILON 6 0 P 1 (SUTURE) IMPLANT
SUT NOVAFIL 6 0 PRE 2 4412 13 (SUTURE) IMPLANT
SUT PLAIN 4 0 ~~LOC~~ 1 (SUTURE) IMPLANT
SUT PROLENE 6 0 BV (SUTURE) IMPLANT
SUT VIC AB 2-0 CP2 18 (SUTURE) IMPLANT
SUT VIC AB 2-0 CT1 27 (SUTURE)
SUT VIC AB 2-0 CT1 27XBRD (SUTURE) IMPLANT
SUT VIC AB 3-0 SH 8-18 (SUTURE) ×2 IMPLANT
SUT VIC AB 4-0 P-3 18X BRD (SUTURE) IMPLANT
SUT VIC AB 4-0 P3 18 (SUTURE)
SWAB COLLECTION DEVICE MRSA (MISCELLANEOUS) IMPLANT
SYR 5ML LL (SYRINGE) IMPLANT
SYR BULB 3OZ (MISCELLANEOUS) ×2 IMPLANT
SYR CONTROL 10ML LL (SYRINGE) ×2 IMPLANT
SYR TB 1ML 25GX5/8 (SYRINGE) IMPLANT
TOWEL OR 17X24 6PK STRL BLUE (TOWEL DISPOSABLE) ×2 IMPLANT
TOWEL OR 17X26 10 PK STRL BLUE (TOWEL DISPOSABLE) ×4 IMPLANT
TRACKER ENT INSTRUMENT (MISCELLANEOUS) ×4 IMPLANT
TRACKER ENT PATIENT (MISCELLANEOUS) ×2 IMPLANT
TRAP SPECIMEN MUCOUS 40CC (MISCELLANEOUS) IMPLANT
TRAY ENT MC OR (CUSTOM PROCEDURE TRAY) ×4 IMPLANT
TRAY FOLEY CATH 16FRSI W/METER (SET/KITS/TRAYS/PACK) ×2 IMPLANT
TUBE ANAEROBIC SPECIMEN COL (MISCELLANEOUS) IMPLANT
TUBE CONNECTING 12X1/4 (SUCTIONS) ×2 IMPLANT
TUBING EXTENTION W/L.L. (IV SETS) ×2 IMPLANT
TUBING STRAIGHTSHOT EPS 5PK (TUBING) ×2 IMPLANT
UNDERPAD 30X30 INCONTINENT (UNDERPADS AND DIAPERS) IMPLANT
WATER STERILE IRR 1000ML POUR (IV SOLUTION) ×6 IMPLANT
WIPE INSTRUMENT VISIWIPE 73X73 (MISCELLANEOUS) ×2 IMPLANT

## 2014-12-29 NOTE — Transfer of Care (Signed)
Immediate Anesthesia Transfer of Care Note  Patient: Kim Guerra  Procedure(s) Performed: Procedure(s) with comments: CRANIOTOMY HYPOPHYSECTOMY TRANSNASAL APPROACH (N/A) - Transphenoidal resection of pituitary tumor harvesting of abdominal fat graft TRANSPHENOIDAL PITUITARY RESECTION WITH IMAGE GUIDANCE (N/A)  Patient Location: PACU  Anesthesia Type:General  Level of Consciousness: awake, alert , patient cooperative and responds to stimulation  Airway & Oxygen Therapy: Patient Spontanous Breathing and Patient connected to face mask oxygen  Post-op Assessment: Report given to RN, Post -op Vital signs reviewed and stable and Patient moving all extremities X 4  Post vital signs: Reviewed and stable  Last Vitals:  Filed Vitals:   12/29/14 1220  BP: 145/94  Pulse:   Temp: 36.6 C  Resp:     Complications: No apparent anesthesia complications

## 2014-12-29 NOTE — Op Note (Signed)
12/29/2014  11:06 AM  PATIENT:  Kim Guerra  49 y.o. female  PRE-OPERATIVE DIAGNOSIS:  pituitary macroadenoma  POST-OPERATIVE DIAGNOSIS:  pituitary macroadenoma  PROCEDURE:  Procedure(s):  Transseptal, transsphenoidal resection of pituitary macroadenoma, with microdissection, microsurgical technique, and the operating microscope.  Harvesting of abdominal adipose autograft.  SURGEON:  Surgeon(s): Jovita Gamma, MD Leta Baptist, MD  ANESTHESIA:   general  EBL:  Total I/O In: 2200 [I.V.:2200] Out: 950 [Urine:450; Blood:500]  BLOOD ADMINISTERED:none  COUNT: Correct per nursing staff  SPECIMEN:  Source of Specimen:  Pituitary macroadenoma  DICTATION: Surgery performed in a co-surgeon fashion with myself, from the neurosurgical service, and Dr. Benjamine Mola, from the ENT service. This is a dictation of the neurosurgical portion of the procedure, the approach and closure, performed by Dr. Benjamine Mola, will be dictated by him.  The patient was brought to the operating room, and placed under general endotracheal anesthesia. Patient was positioned on a horseshoe head rest, with the head tilted slightly to the left, and the neck gently flexed. The C-arm fluoroscope was set up, and the expanded sella identified and the lateral view. The right side of the abdomen was prepped with DuraPrep and draped in a sterile fashion.  Dr. Benjamine Mola prep the face and draped it out. He performed the exposure, a transseptal transsphenoidal approach to the sphenoid sinus. Once good exposure of the sphenoid sinus and anterior wall of the sella was achieved, a self-retaining retractor was placed. The C-arm fluoroscope was used to confirm adequate exposure.   At this point the surgical care was transferred to me, and I proceeded with the neurosurgical portion of the procedure, specifically resection of the tumor. The operating microscope was draped and brought into the field to provide additional magnification, illumination, and  visualization.  We proceeded with harvesting the abdominal wall adipose autograft. The line of the incision was infiltrated with local anesthetic with epinephrine, and a horizontal incision was made in the left side of the abdomen. Dissection was carried down to the subcutaneous tissue. Using sharp dissection, adipose tissue was harvested for autograft. Electrocautery was used then to establish hemostasis, and the incision was then closed. 3-0 interrupted inverted undyed Vicryl suture was used to close the subcutaneous and subcuticular layer. Dermabond was used to close the skin edges.  The anterior wall the sella was found to be intact, and therefore was opened with the high-speed drill using a diamond burr.  Once the initial exposure to the anterior wall of the sella was achieved, this was expanded using a variety of Kerrison punches. We then open the underlying dura, and immediately encountered the tumor. Tumor was removed using a variety of small ring curettes, specimens of tumor were placed initially and saline, to be later placed in formalin for pathologic examination. We continued the tumor resection and began to note the arachnoid above the sella, and felt that we had achieved good decompression of the superior extent of the tumor. We then turned attention to the lateral and inferior portions of tumor, again using a variety of ring curettes to continue and complete the tumor resection. In the end we were able to appreciate the lateral and inferior walls of the sella, and felt that a gross total resection of tumor was achieved. Hemostasis was established with the use of bipolar cautery and Gelfoam with thrombin. The Gelfoam was removed, and pledgets of the adipose autograft were placed in the tumor resection cavity, and hemostasis established and confirmed. We then cut a piece of nasal septal  cartilage to use as an anterior buttress, was positioned just inside the opening of the bone anterior wall of the  sella.  At this point the surgical care was transferred back to Dr. Benjamine Mola, for closure. Following surgery the patient is to be reversed from the anesthetic, extubated, and transferred to the recovery room, to be subsequently transferred to the intensive care unit for further care.  PLAN OF CARE: Admit to inpatient    Delay start of Pharmacological VTE agent (>24hrs) due to surgical blood loss or risk of bleeding:  yes

## 2014-12-29 NOTE — H&P (Signed)
Subjective: Patient is a 49 y.o. female who is admitted for treatment of pituitary macroadenoma, presenting with mild acromegaly, and a significantly elevated insulin like growth factor. The remainder of the patient's pituitary endocrinologic profile is unremarkable. She does not have a visual field defect by testing by confrontation, despite compression and displacement of the optic chiasm and nerves. She is admitted now for transsphenoidal resection of the pituitary macroadenoma and a combined fashion between myself from the neurosurgical service and Dr. Benjamine Mola from the ENT service.   Patient Active Problem List   Diagnosis Date Noted  . Abnormal EKG 11/04/2014  . Dyspnea 11/04/2014  . Essential hypertension 11/04/2014   Past Medical History  Diagnosis Date  . Pituitary macroadenoma   . PONV (postoperative nausea and vomiting)   . Dysrhythmia     pvc's  . Anemia     Past Surgical History  Procedure Laterality Date  . Ectopic pregnancy surgery    . Wisdom tooth extraction      Prescriptions prior to admission  Medication Sig Dispense Refill Last Dose  . acetaminophen (TYLENOL) 325 MG tablet Take 650 mg by mouth every 6 (six) hours as needed for mild pain.   Past Week at Unknown time  . ALPRAZolam (XANAX) 0.25 MG tablet Take 0.25 mg by mouth 2 (two) times daily as needed for anxiety.   12/28/2014 at Unknown time   Allergies  Allergen Reactions  . Levofloxacin     Mood swings  . Sulfa Antibiotics Nausea Only    Stomach cramps    History  Substance Use Topics  . Smoking status: Never Smoker   . Smokeless tobacco: Not on file  . Alcohol Use: Not on file    Family History  Problem Relation Age of Onset  . Lung cancer Father 35  . Sarcoidosis Sister   . Lung cancer Brother 53     Review of Systems A comprehensive review of systems was negative.  Objective: Vital signs in last 24 hours: Temp:  [98.8 F (37.1 C)] 98.8 F (37.1 C) (04/14 0556) Pulse Rate:  [104] 104  (04/14 0556) Resp:  [18] 18 (04/14 0556) BP: (165)/(100) 165/100 mmHg (04/14 0556) SpO2:  [100 %] 100 % (04/14 0556)  EXAM: Patient is a well-developed well-nourished white female in no acute distress.  Lungs are clear to auscultation , the patient has symmetrical respiratory excursion. Heart has a regular rate and rhythm normal S1 and S2 no murmur.   Abdomen is soft nontender nondistended bowel sounds are present. Extremity examination shows no clubbing cyanosis or edema. Mental status shows the patient is awake, alert, fully oriented. Speech is fluent, she has good comprehension. Cranial nerve show on funduscopic examination no evidence of papilledema, hemorrhage, or exudate. Visual field testing to confrontation with a red target shows no visual field deficit. Extraocular movements are intact. Facial sensation is intact. Facial movements symmetrical. Hearing is present bilaterally. Palatal movements symmetrical. Shoulder shrug is symmetrical. Tongue is midline. Motor examination shows 5 over 5 strength in the upper extremities including the deltoid biceps triceps and intrinsics and grip. Sensation is intact to pinprick throughout the digits of the upper extremities. Reflexes are symmetrical and without evidence of pathologic reflexes. Patient has a normal gait and stance.   Data Review:CBC    Component Value Date/Time   WBC 6.8 12/20/2014 0842   RBC 4.56 12/20/2014 0842   HGB 11.9* 12/20/2014 0842   HCT 37.2 12/20/2014 0842   PLT 311 12/20/2014 0842   MCV  81.6 12/20/2014 0842   MCH 26.1 12/20/2014 0842   MCHC 32.0 12/20/2014 0842   RDW 13.3 12/20/2014 0842                          BMET    Component Value Date/Time   NA 140 12/20/2014 0842   K 3.4* 12/20/2014 0842   CL 106 12/20/2014 0842   CO2 31 12/20/2014 0842   GLUCOSE 110* 12/20/2014 0842   BUN 11 12/20/2014 0842   CREATININE 0.61 12/20/2014 0842   CALCIUM 9.5 12/20/2014 0842   GFRNONAA >90 12/20/2014 0842   GFRAA >90  12/20/2014 0842     Assessment/Plan: Patient with a large pituitary macroadenoma, with elevated IGF, with extension into the suprasellar cistern and compression of the optic chiasm and nerves, who is admitted for transsphenoidal resection of the pituitary macroadenoma.  I spoke with the patient at length regarding her condition, reviewed her MRI with her, and discussed the indication and nature of surgery. We discussed risks of surgery including risks of infection, including sinusitis and meningitis, as well as risk of serious bleeding requiring transfusion, the risk of neurologic dysfunction including loss of vision, paralysis, coma, and death, the risk of CSF leakage and possibly for further surgery, and anesthetic risk of myocardial infarction, stroke, pneumonia, and death. We discussed tip of surgery, hospital stay, ICU stay, and overall recuperation. Her limitations during the postoperative period. After discussing all this patient does wish to proceed with surgery and is admitted for such. She has been seen in preoperative evaluation by Dr. Benjamine Mola who will be performing the approach and closure for the procedure.   Hosie Spangle, MD 12/29/2014 7:26 AM

## 2014-12-29 NOTE — Progress Notes (Signed)
Subjective: Patient resting in bed, some headache, but otherwise comfortable. Wanted some ice, started on clear liquids, tolerating ice well so far. Labs after surgery looked good. Urine output stable. Specific gravity 1.010 - 1.012, good.  Objective: Vital signs in last 24 hours: Filed Vitals:   12/29/14 1800 12/29/14 1900 12/29/14 2000 12/29/14 2100  BP: 130/74 149/87 134/75 140/92  Pulse: 89 105 84 95  Temp:   98 F (36.7 C)   TempSrc:   Oral   Resp: 13 23 23 14   SpO2: 99% 100% 100% 100%    Intake/Output from previous day:   Intake/Output this shift: Total I/O In: 198.3 [I.V.:198.3] Out: 265 [Urine:265]  Physical Exam:  Awake and alert, oriented. Following commands. Moving all 4 cavities well. No drift of upper extremity. Vision good. EOMI.  BMET  Recent Labs  12/29/14 1309  NA 139  K 3.7  CL 113*  CO2 20  GLUCOSE 164*  BUN <5*  CREATININE 0.48*  CALCIUM 7.7*    Studies/Results: Dg Skull 1-3 Views  12/29/2014   CLINICAL DATA:  Transsphenoidal resection of pituitary tumor.  EXAM: DG C-ARM 61-120 MIN; SKULL - 1-3 VIEW  FLUOROSCOPY TIME:  C-arm fluoroscopic images were obtained intraoperatively and submitted for post operative interpretation. Please see the performing provider's procedural report for the fluoroscopy time utilized.  COMPARISON:  Brain MRI 10/17/2014.  FINDINGS: Two lateral intraoperative spot fluoroscopic images of the skull centered on the sella turcica are provided. The first demonstrates the tip of a surgical probe projecting in the region of the sphenoid sinus just anterior to the sella. The second image demonstrates a small marker projecting over the central aspect of the sella.  IMPRESSION: Intraoperative images during transsphenoidal pituitary resection.   Electronically Signed   By: Logan Bores   On: 12/29/2014 12:28   Dg C-arm 61-120 Min  12/29/2014   CLINICAL DATA:  Transsphenoidal resection of pituitary tumor.  EXAM: DG C-ARM 61-120 MIN; SKULL  - 1-3 VIEW  FLUOROSCOPY TIME:  C-arm fluoroscopic images were obtained intraoperatively and submitted for post operative interpretation. Please see the performing provider's procedural report for the fluoroscopy time utilized.  COMPARISON:  Brain MRI 10/17/2014.  FINDINGS: Two lateral intraoperative spot fluoroscopic images of the skull centered on the sella turcica are provided. The first demonstrates the tip of a surgical probe projecting in the region of the sphenoid sinus just anterior to the sella. The second image demonstrates a small marker projecting over the central aspect of the sella.  IMPRESSION: Intraoperative images during transsphenoidal pituitary resection.   Electronically Signed   By: Logan Bores   On: 12/29/2014 12:28    Assessment/Plan: We'll change vital signs to every 2 hours. We'll decrease maintenance IV fluids (with hydrocortisone) to 75 mL per hour. We'll reevaluate in a.m., and if doing well, begin to advance diet, begin to mobilize out of bed, etc.   Hosie Spangle, MD 12/29/2014, 9:43 PM

## 2014-12-29 NOTE — Progress Notes (Signed)
Subjective: Patient resting in PACU, with head of bed at 30.   Objective: Vital signs in last 24 hours: Filed Vitals:   12/29/14 0556 12/29/14 1220  BP: 165/100 145/94  Pulse: 104   Temp: 98.8 F (37.1 C) 97.9 F (36.6 C)  TempSrc: Oral   Resp: 18   SpO2: 100%     Physical Exam:  Mildly drowsy, but following commands. Moving all 4 extremities to command. Able to easily count fingers.   Assessment/Plan: To continue to be stabilized in PACU, to be transferred to neurosurgery ICU for further care. Patient to continue on hydrocortisone drip, which we will gradually tapered over the next few days. Nothing by mouth for now.   Hosie Spangle, MD 12/29/2014, 12:29 PM

## 2014-12-29 NOTE — Anesthesia Procedure Notes (Addendum)
Procedure Name: Intubation Performed by: Judeth Cornfield T Pre-anesthesia Checklist: Patient identified, Emergency Drugs available, Suction available, Patient being monitored and Timeout performed Patient Re-evaluated:Patient Re-evaluated prior to inductionOxygen Delivery Method: Circle system utilized Preoxygenation: Pre-oxygenation with 100% oxygen Intubation Type: IV induction Ventilation: Mask ventilation without difficulty Laryngoscope Size: Mac and 3 Grade View: Grade III Tube type: Oral Number of attempts: 2 Airway Equipment and Method: Video-laryngoscopy Placement Confirmation: ETT inserted through vocal cords under direct vision,  positive ETCO2,  CO2 detector and breath sounds checked- equal and bilateral Secured at: 22 cm Tube secured with: Tape Difficulty Due To: Difficulty was anticipated Future Recommendations: Recommend- induction with short-acting agent, and alternative techniques readily available    Pt states with her previous sx in 2008 she overhead nurses saying that there was difficulty getting a tube in.  Pt did not receive a letter and no one spoke to her directly about being a difficult intubation. Glidescope at [facility].

## 2014-12-29 NOTE — H&P (Signed)
Cc: Pituitary tumor  HPI: The patient is a 49 year old female who presents today for evaluation to treat her pituitary tumor. According to the patient, she was recently diagnosed as having elevated growth hormone by Dr. Chalmers Cater. Her subsequent MRI scan showed a pituitary tumor. The patient was evaluated by Dr. Jovita Gamma. The decision was made for the patient to undergo transsphenoidal resection of her pituitary tumor. The patient has no previous history of sinonasal surgery. She reports occasional left-sided nasal congestion. She has no recent sinus infection. She denies any facial pain, fever, or visual change.  The patient's review of systems (constitutional, eyes, ENT, cardiovascular, respiratory, GI, musculoskeletal, skin, neurologic, psychiatric, endocrine, hematologic, allergic) is noted in the ROS questionnaire. It is reviewed with the patient. Allergies: Levaquin, sulfa Family health history: None.  Major events: Wisdom teeth removed, Fallopian tube in 2008.  Ongoing medical problems: Environmental allergies.  Social history: The patient is married. She denies the use of alcohol, tobacco or illegal drugs.   Exam General: Communicates without difficulty, well nourished, no acute distress. Head: Normocephalic, no evidence injury, no tenderness, facial buttresses intact without stepoff. Eyes: PERRL, EOMI. No scleral icterus, conjunctivae clear. Neuro: CN II exam reveals vision grossly intact. No nystagmus at any point of gaze. Ears: Auricles well formed without lesions. Ear canals are intact without mass or lesion. No erythema or edema is appreciated. The TMs are intact without fluid. Nose: External evaluation reveals normal support and skin without lesions. Dorsum is intact. Anterior rhinoscopy reveals mildly congested and edematous mucosa over anterior aspect of the inferior turbinates and nasal septum. No purulence is noted. Oral: Oral cavity and oropharynx are intact, symmetric, without  erythema or edema. Mucosa is moist without lesions. Neck: Full range of motion without pain. There is no significant lymphadenopathy. No masses palpable. Thyroid bed within normal limits to palpation. Parotid glands and submandibular glands equal bilaterally without mass. Trachea is midline. Neuro: CN 2-12 grossly intact. Gait normal.   Assessment  1. The patient has a newly diagnosed pituitary tumor. Her growth hormone was noted to be elevated.  2. Mild chronic rhinitis with nasal septal deviation to the left. No other sinonasal disease is noted today.   Olan 1. The physical exam findings and nasal endoscopy findings are reviewed with the patient.  2. The patient is scheduled to undergo transsphenoidal resection of her pituitary tumor. The surgery will be performed in conjunction with Dr. Jovita Gamma.  3. The risks, benefits, alternatives and details of the procedure are reviewed with the patient. Questions are invited and answered.

## 2014-12-29 NOTE — Progress Notes (Signed)
Placed pt on an aerosol face tent at 5L/28% per RN post op.

## 2014-12-29 NOTE — Op Note (Signed)
DATE OF PROCEDURE:  12/29/2014                              OPERATIVE REPORT  SURGEON:  Leta Baptist, MD  PREOPERATIVE DIAGNOSES: 1. Pituitary macroadenoma  POSTOPERATIVE DIAGNOSES: 1. Pituitary macroadenoma  PROCEDURE PERFORMED: Transseptal, transsphenoidal approach for the resection of pituitary macroadenoma   ANESTHESIA:  General ET anesthesia.  COMPLICATIONS:  None.  ESTIMATED BLOOD LOSS:  570ml  INDICATION FOR PROCEDURE:   Kim Guerra is a 49 y.o. female with a history of pituitary macroadenoma. She was recently diagnosed as having elevated growth hormone by Dr. Chalmers Cater. Her subsequent MRI scan showed a pituitary tumor. The patient was evaluated by Dr. Jovita Gamma. The decision was made for the patient to undergo transsphenoidal resection of her pituitary tumor. The patient has no previous history of sinonasal surgery.   DESCRIPTION:  The patient was taken to the operating room and placed supine on the operating table.  General endotracheal tube anesthesia was administered by the anesthesiologist.  After adequate general anesthesia was established the patient's nasal septum was injected with 1% lidocaine with 1:100,000 units of epinephrine. The nasal cavity was decongested bilaterally with 1:100,000 epinephrine solution on cotton pledgets. The patient was draped in a sterile fashion. The septum was deviated to the left with a large spur projecting into the left nasal cavity. A left hemitransfixion incision was made and a left anterior tunnel was elevated. A strip of cartilage was removed from the midportion of the septum over the maxillary crest. Bilateral inferior tunnels were created around the maxillary crest and the maxillary crest was removed with an osteotome. The nasal spine was also lowered with an osteotome. The majority of the bony septum was removed back to the sphenoid rostrum. The anterior face of the sphenoid was removed with adequate exposure to the posterior table of the  sphenoid sinus and the area of the pituitary tumor. A self retaining pituitary retractor was placed. The resection of the tumor was dictated by Dr. Sherwood Gambler. At the end of the procedure. The sphenoid sinus was packed with Xeroform strip. The hemitransfixion incision was closed with interrupted 3-0 chromic sutures. Bilateral Merocel packings were placed.  The care of the patient was turned over to the anesthesiologist. The patient was awakened from anesthesia without difficulty. She was extubated and transferred to the recovery room in good condition.  OPERATIVE FINDINGS:  Transseptal, transsphenoidal approach to the sella was performed without difficulty.  FOLLOWUP CARE:  Admit to ICU.  [name] [facility] 12/29/2014

## 2014-12-29 NOTE — Anesthesia Preprocedure Evaluation (Addendum)
Anesthesia Evaluation  Patient identified by MRN, date of birth, ID band Patient awake    Reviewed: Allergy & Precautions, NPO status , Patient's Chart, lab work & pertinent test results  History of Anesthesia Complications (+) PONV  Airway Mallampati: III  TM Distance: >3 FB Neck ROM: Full    Dental  (+) Teeth Intact, Dental Advisory Given   Pulmonary shortness of breath and with exertion,  breath sounds clear to auscultation        Cardiovascular hypertension, + dysrhythmias Rhythm:Regular Rate:Normal  11/2014 TTE: EF 65-70%.   Neuro/Psych negative neurological ROS     GI/Hepatic negative GI ROS, Neg liver ROS,   Endo/Other  Morbid obesity  Renal/GU negative Renal ROS     Musculoskeletal negative musculoskeletal ROS (+)   Abdominal   Peds  Hematology  (+) anemia , Hgb 11.9   Anesthesia Other Findings   Reproductive/Obstetrics Serum pregnancy negative                            Anesthesia Physical Anesthesia Plan  ASA: II  Anesthesia Plan: General   Post-op Pain Management:    Induction: Intravenous  Airway Management Planned: Oral ETT  Additional Equipment: Arterial line  Intra-op Plan:   Post-operative Plan: Extubation in OR  Informed Consent: I have reviewed the patients History and Physical, chart, labs and discussed the procedure including the risks, benefits and alternatives for the proposed anesthesia with the patient or authorized representative who has indicated his/her understanding and acceptance.   Dental advisory given  Plan Discussed with: CRNA  Anesthesia Plan Comments:         Anesthesia Quick Evaluation

## 2014-12-29 NOTE — Anesthesia Postprocedure Evaluation (Signed)
  Anesthesia Post-op Note  Patient: Kim Guerra  Procedure(s) Performed: Procedure(s) with comments: CRANIOTOMY HYPOPHYSECTOMY TRANSNASAL APPROACH (N/A) - Transphenoidal resection of pituitary tumor harvesting of abdominal fat graft TRANSPHENOIDAL PITUITARY RESECTION WITH IMAGE GUIDANCE (N/A)  Patient Location: PACU  Anesthesia Type:General  Level of Consciousness: awake and alert   Airway and Oxygen Therapy: Patient Spontanous Breathing  Post-op Pain: mild  Post-op Assessment: Post-op Vital signs reviewed  Post-op Vital Signs: Reviewed  Last Vitals:  Filed Vitals:   12/29/14 1330  BP: 116/75  Pulse: 85  Temp:   Resp: 18    Complications: No apparent anesthesia complications

## 2014-12-30 LAB — BASIC METABOLIC PANEL
Anion gap: 10 (ref 5–15)
Anion gap: 9 (ref 5–15)
BUN: <5 mg/dL — ABNORMAL LOW (ref 6–23)
BUN: 6 mg/dL (ref 6–23)
CO2: 21 mmol/L (ref 19–32)
CO2: 24 mmol/L (ref 19–32)
Calcium: 8.9 mg/dL (ref 8.4–10.5)
Calcium: 9.2 mg/dL (ref 8.4–10.5)
Chloride: 112 mmol/L (ref 96–112)
Chloride: 111 mmol/L (ref 96–112)
Creatinine, Ser: 0.6 mg/dL (ref 0.50–1.10)
Creatinine, Ser: 0.76 mg/dL (ref 0.50–1.10)
GFR calc Af Amer: >90 mL/min (ref >90)
GFR calc Af Amer: >90 mL/min (ref >90)
GFR calc non Af Amer: >90 mL/min (ref >90)
GFR calc non Af Amer: >90 mL/min (ref >90)
Glucose, Bld: 186 mg/dL — ABNORMAL HIGH (ref 70–99)
Glucose, Bld: 127 mg/dL — ABNORMAL HIGH (ref 70–99)
Potassium: 3.9 mmol/L (ref 3.5–5.1)
Potassium: 4 mmol/L (ref 3.5–5.1)
Sodium: 143 mmol/L (ref 135–145)
Sodium: 144 mmol/L (ref 135–145)

## 2014-12-30 MED ORDER — HYDROCORTISONE 20 MG PO TABS
20.0000 mg | ORAL_TABLET | ORAL | Status: DC
  Filled 2014-12-30: qty 1

## 2014-12-30 MED ORDER — HYDROCORTISONE 20 MG PO TABS
20.0000 mg | ORAL_TABLET | Freq: Every day | ORAL | Status: DC
  Administered 2014-12-31 – 2015-01-01 (×2): 20 mg via ORAL
  Filled 2014-12-30 (×3): qty 1

## 2014-12-30 MED ORDER — DESMOPRESSIN ACETATE 4 MCG/ML IJ SOLN
1.0000 ug | Freq: Once | INTRAMUSCULAR | Status: DC
  Filled 2014-12-30: qty 0.25

## 2014-12-30 MED ORDER — ACETAMINOPHEN 325 MG PO TABS
325.0000 mg | ORAL_TABLET | ORAL | Status: DC | PRN
  Administered 2014-12-30 – 2015-01-01 (×7): 650 mg via ORAL
  Filled 2014-12-30 (×7): qty 2

## 2014-12-30 MED ORDER — DESMOPRESSIN ACETATE 4 MCG/ML IJ SOLN
1.0000 ug | Freq: Once | INTRAMUSCULAR | Status: AC
  Administered 2014-12-30: 1 ug via INTRAVENOUS
  Filled 2014-12-30: qty 0.25

## 2014-12-30 MED ORDER — HYDROCORTISONE 10 MG PO TABS
10.0000 mg | ORAL_TABLET | Freq: Every evening | ORAL | Status: DC
  Administered 2014-12-31: 10 mg via ORAL
  Filled 2014-12-30 (×2): qty 1

## 2014-12-30 NOTE — Progress Notes (Signed)
Subjective: Pt reports pressure in her nose and face. Otherwise doing well. Tolerated some po liquid.  Objective: Vital signs in last 24 hours: Temp:  [97.9 F (36.6 C)-98.8 F (37.1 C)] 98.6 F (37 C) (04/15 1113) Pulse Rate:  [81-111] 93 (04/15 1000) Resp:  [11-41] 15 (04/15 1000) BP: (113-149)/(62-101) 149/97 mmHg (04/15 1000) SpO2:  [94 %-100 %] 98 % (04/15 1000) Arterial Line BP: (105-196)/(67-133) 147/101 mmHg (04/15 0800) FiO2 (%):  [28 %] 28 % (04/14 2000)  Pt is awake and alert. PERRL, EOMI. Vision intact. Nasal packing in place. No significant bleeding.  No results for input(s): WBC, HGB, HCT, PLT in the last 72 hours.  Recent Labs  12/29/14 1309 12/30/14 0431  NA 139 143  K 3.7 3.9  CL 113* 112  CO2 20 21  GLUCOSE 164* 186*  BUN <5* <5*  CREATININE 0.48* 0.60  CALCIUM 7.7* 8.9    Medications:  I have reviewed the patient's current medications. Scheduled: . cefTRIAXone (ROCEPHIN)  IV  1 g Intravenous Q24H  . [START ON 12/31/2014] hydrocortisone  10 mg Oral QPM  . [START ON 12/31/2014] hydrocortisone  20 mg Oral Daily  . pantoprazole (PROTONIX) IV  40 mg Intravenous QHS   KGU:RKYHCWCBJS, bisacodyl, hydrALAZINE, HYDROcodone-acetaminophen, hydrOXYzine, hydrOXYzine, labetalol, magnesium hydroxide, morphine injection, ondansetron **OR** ondansetron (ZOFRAN) IV  Assessment/Plan: POD #1 s/p transphenoidal resection of pituitary macroadenoma. Recovering well. Will leave nasal packing in until next Wednesday.   LOS: 1 day   Coen Miyasato,SUI W 12/30/2014, 11:29 AM

## 2014-12-30 NOTE — Progress Notes (Signed)
Subjective: Patient resting in bed, fairly comfortable. Did have increased urine output overnight, with a decrease in his urine specific gravity to 1.003. Given 1 g of DDAVP IV by Dr. Ronnald Ramp, and urine output has been stable since. Sodium this morning 143. Taking clear liquids well, hungry.  Objective: Vital signs in last 24 hours: Filed Vitals:   12/30/14 0341 12/30/14 0400 12/30/14 0600 12/30/14 0726  BP:  130/101 140/86   Pulse:  100 93   Temp: 98.8 F (37.1 C)   98.5 F (36.9 C)  TempSrc: Oral   Oral  Resp:  21 26   SpO2:  98% 97%     Intake/Output from previous day: 04/14 0701 - 04/15 0700 In: 4588.8 [P.O.:720; I.V.:3868.8] Out: 3825 [Urine:4750; Blood:500] Intake/Output this shift:    Physical Exam:  Awake alert, oriented. Following commands. Moving all 4 extremities well. Vision good. EOMI.  BMET  Recent Labs  12/29/14 1309 12/30/14 0431  NA 139 143  K 3.7 3.9  CL 113* 112  CO2 20 21  GLUCOSE 164* 186*  BUN <5* <5*  CREATININE 0.48* 0.60  CALCIUM 7.7* 8.9    Assessment/Plan: Patient doing well following transsphenoidal resection of pituitary macroadenoma. We'll need to continue to monitor I's and O's strictly, requiring continued use of Foley catheter, discussed with patient's RN.  We'll begin out of bed to chair, and advance to ambulate in ICU. Will advance to regular diet. We'll recheck BMET this evening and again in a.m.  We'll decrease hydrocortisone drip to 50 mL/h, with a plan to decrease to 25 mL/h this evening, and to convert to by mouth 100 cortisone in a.m., and DC hydrocortisone drip after first oral dose of hydrocortisone.   Hosie Spangle, MD 12/30/2014, 7:59 AM

## 2014-12-30 NOTE — Progress Notes (Signed)
Notified Dr. Ronnald Ramp of patient's increasing urine output and specific gravity of 1.003. Orders received for 75mcg ddavp. Will continue to monitor.

## 2014-12-31 LAB — BASIC METABOLIC PANEL
Anion gap: 12 (ref 5–15)
BUN: 7 mg/dL (ref 6–23)
CO2: 22 mmol/L (ref 19–32)
Calcium: 8.8 mg/dL (ref 8.4–10.5)
Chloride: 108 mmol/L (ref 96–112)
Creatinine, Ser: 0.61 mg/dL (ref 0.50–1.10)
GFR calc Af Amer: >90 mL/min (ref >90)
GFR calc non Af Amer: >90 mL/min (ref >90)
Glucose, Bld: 117 mg/dL — ABNORMAL HIGH (ref 70–99)
Potassium: 3.6 mmol/L (ref 3.5–5.1)
Sodium: 142 mmol/L (ref 135–145)

## 2014-12-31 NOTE — Progress Notes (Signed)
Subjective: Pt reports slight improvement in her facial pressure and headache. Her vision has improved postop.  Objective: Vital signs in last 24 hours: Temp:  [98.3 F (36.8 C)-99.4 F (37.4 C)] 98.3 F (36.8 C) (04/16 1600) Pulse Rate:  [70-105] 103 (04/16 1800) Resp:  [13-31] 22 (04/16 1800) BP: (119-144)/(62-88) 139/84 mmHg (04/16 1800) SpO2:  [96 %-100 %] 97 % (04/16 1800)  Pt is awake and alert. PERRL, EOMI. Vision intact. Nasal packing in place. No significant bleeding.  No results for input(s): WBC, HGB, HCT, PLT in the last 72 hours.  Recent Labs  12/30/14 1835 12/31/14 0603  NA 144 142  K 4.0 3.6  CL 111 108  CO2 24 22  GLUCOSE 127* 117*  BUN 6 7  CREATININE 0.76 0.61  CALCIUM 9.2 8.8    Medications:  I have reviewed the patient's current medications. Scheduled: . cefTRIAXone (ROCEPHIN)  IV  1 g Intravenous Q24H  . hydrocortisone  10 mg Oral QPM  . hydrocortisone  20 mg Oral Daily   JQB:HALPFXTKWIOXB, ALPRAZolam, bisacodyl, HYDROcodone-acetaminophen, hydrOXYzine, hydrOXYzine, magnesium hydroxide, ondansetron **OR** ondansetron (ZOFRAN) IV  Assessment/Plan: POD #2 s/p transphenoidal resection of pituitary macroadenoma. Recovering well. Will leave nasal packing in until Wednesday. Pt will follow up in my office on Wednesday (01/04/15) at 1pm for packing removal. Abx coverage (keflex or amoxicillin) while packing is in place.     LOS: 2 days   Oreatha Fabry,SUI W 12/31/2014, 6:25 PM

## 2014-12-31 NOTE — Progress Notes (Addendum)
Subjective: Patient reports Doing well no headache improved vision better urine output  Objective: Vital signs in last 24 hours: Temp:  [98.3 F (36.8 C)-99.9 F (37.7 C)] 98.6 F (37 C) (04/16 0358) Pulse Rate:  [89-112] 98 (04/16 0000) Resp:  [14-24] 21 (04/16 0000) BP: (126-149)/(79-108) 141/85 mmHg (04/16 0000) SpO2:  [98 %-100 %] 98 % (04/16 0000) Arterial Line BP: (147)/(101) 147/101 mmHg (04/15 0800)  Intake/Output from previous day: 04/15 0701 - 04/16 0700 In: 2406 [P.O.:1631; I.V.:775] Out: 3410 [Urine:3410] Intake/Output this shift: Total I/O In: 947 [P.O.:747; I.V.:200] Out: 1160 [Urine:1160]  Awake alert oriented  Lab Results: No results for input(s): WBC, HGB, HCT, PLT in the last 72 hours. BMET  Recent Labs  12/30/14 0431 12/30/14 1835  NA 143 144  K 3.9 4.0  CL 112 111  CO2 21 24  GLUCOSE 186* 127*  BUN <5* 6  CREATININE 0.60 0.76  CALCIUM 8.9 9.2    Studies/Results: Dg Skull 1-3 Views  12/29/2014   CLINICAL DATA:  Transsphenoidal resection of pituitary tumor.  EXAM: DG C-ARM 61-120 MIN; SKULL - 1-3 VIEW  FLUOROSCOPY TIME:  C-arm fluoroscopic images were obtained intraoperatively and submitted for post operative interpretation. Please see the performing provider's procedural report for the fluoroscopy time utilized.  COMPARISON:  Brain MRI 10/17/2014.  FINDINGS: Two lateral intraoperative spot fluoroscopic images of the skull centered on the sella turcica are provided. The first demonstrates the tip of a surgical probe projecting in the region of the sphenoid sinus just anterior to the sella. The second image demonstrates a small marker projecting over the central aspect of the sella.  IMPRESSION: Intraoperative images during transsphenoidal pituitary resection.   Electronically Signed   By: Logan Bores   On: 12/29/2014 12:28   Dg C-arm 61-120 Min  12/29/2014   CLINICAL DATA:  Transsphenoidal resection of pituitary tumor.  EXAM: DG C-ARM 61-120 MIN;  SKULL - 1-3 VIEW  FLUOROSCOPY TIME:  C-arm fluoroscopic images were obtained intraoperatively and submitted for post operative interpretation. Please see the performing provider's procedural report for the fluoroscopy time utilized.  COMPARISON:  Brain MRI 10/17/2014.  FINDINGS: Two lateral intraoperative spot fluoroscopic images of the skull centered on the sella turcica are provided. The first demonstrates the tip of a surgical probe projecting in the region of the sphenoid sinus just anterior to the sella. The second image demonstrates a small marker projecting over the central aspect of the sella.  IMPRESSION: Intraoperative images during transsphenoidal pituitary resection.   Electronically Signed   By: Logan Bores   On: 12/29/2014 12:28    Assessment/Plan: Continue observed in the ICU with urine output workup possibly transferred to floor later today possible discharge tomorrow. Sodium stable this morning lab still pending patient is currently on hydrocortisone 20 the morning 10 at night  LOS: 2 days     Kim Guerra P 12/31/2014, 6:08 AM

## 2015-01-01 LAB — BASIC METABOLIC PANEL
Anion gap: 9 (ref 5–15)
BUN: 8 mg/dL (ref 6–23)
CO2: 24 mmol/L (ref 19–32)
Calcium: 8.5 mg/dL (ref 8.4–10.5)
Chloride: 106 mmol/L (ref 96–112)
Creatinine, Ser: 0.65 mg/dL (ref 0.50–1.10)
GFR calc Af Amer: >90 mL/min (ref >90)
GFR calc non Af Amer: >90 mL/min (ref >90)
Glucose, Bld: 110 mg/dL — ABNORMAL HIGH (ref 70–99)
Potassium: 3.8 mmol/L (ref 3.5–5.1)
Sodium: 139 mmol/L (ref 135–145)

## 2015-01-01 MED ORDER — HYDROCORTISONE 20 MG PO TABS: 20.0000 mg | ORAL_TABLET | Freq: Every day | ORAL | Status: DC

## 2015-01-01 MED ORDER — HYDROCORTISONE 10 MG PO TABS: 10.0000 mg | ORAL_TABLET | Freq: Every evening | ORAL | Status: DC

## 2015-01-01 MED ORDER — CEPHALEXIN 500 MG PO CAPS: 500.0000 mg | ORAL_CAPSULE | Freq: Four times a day (QID) | ORAL | Status: DC

## 2015-01-01 MED ORDER — HYDROCODONE-ACETAMINOPHEN 5-325 MG PO TABS: 1.0000 | ORAL_TABLET | ORAL | Status: DC | PRN

## 2015-01-01 NOTE — Progress Notes (Signed)
Subjective: Patient reports Doing very well minimal headache no excessive thirst  Objective: Vital signs in last 24 hours: Temp:  [98.2 F (36.8 C)-99.2 F (37.3 C)] 98.2 F (36.8 C) (04/17 0726) Pulse Rate:  [70-103] 77 (04/17 0700) Resp:  [12-26] 14 (04/17 0700) BP: (119-148)/(72-92) 130/92 mmHg (04/17 0600) SpO2:  [96 %-100 %] 99 % (04/17 0700) Weight:  [75.8 kg (167 lb 1.7 oz)] 75.8 kg (167 lb 1.7 oz) (04/17 0400)  Intake/Output from previous day: 04/16 0701 - 04/17 0700 In: 1860 [P.O.:1800; I.V.:10; IV Piggyback:50] Out: 3159 [Urine:4325; Stool:1] Intake/Output this shift: Total I/O In: 50 [IV Piggyback:50] Out: 175 [Urine:175]  Awake alert and oriented neurologically intact  Lab Results: No results for input(s): WBC, HGB, HCT, PLT in the last 72 hours. BMET  Recent Labs  12/31/14 0603 01/01/15 0215  NA 142 139  K 3.6 3.8  CL 108 106  CO2 22 24  GLUCOSE 117* 110*  BUN 7 8  CREATININE 0.61 0.65  CALCIUM 8.8 8.5    Studies/Results: No results found.  Assessment/Plan: Postop transsphenoidal hypophysis ectomy overall doing very well the ice seems to resolve urine output seems to be stabilizing. Sodium levels are within normal limits 139. Foley will be DC'd this morning if the patient can void discharged later this afternoon. Scheduled follow-up with Dr. Sherwood Gambler, ENT.  LOS: 3 days     Joclyn Alsobrook P 01/01/2015, 8:14 AM

## 2015-01-01 NOTE — Discharge Instructions (Signed)
No lifting no bending no twisting no driving °

## 2015-01-01 NOTE — Discharge Summary (Signed)
  Physician Discharge Summary  Patient ID: Kim Guerra MRN: [id number] DOB/AGE: November 23, 1965 49 y.o.  Admit date: 12/29/2014 Discharge date: 01/01/2015  Admission Diagnoses: Pituitary macroadenoma  Discharge Diagnoses: Same Active Problems:   Pituitary macroadenoma with extrasellar extension   Discharged Condition: good  Hospital Course: Patient admitted hospital underwent transsphenoidal hypophysis ectomy postoperative patient did fairly well with recovered in the ICU in the ICU patient experienced some increased urine output some signs of DI received DDAVP and responded very well. Over the last couple days urine up as stabilized patient's feeling well sodiums have been stable and patient can be discharged home scheduled follow-up with both an ENT doctor Sherwood Gambler and her endocrinologist. Patient will be discharged on Keflex well the packings in place is instructed to call for increased urine output or excessive thirst.  Consults: Significant Diagnostic Studies: Treatments: Transsphenoidal hypophysis ectomy for pituitary macroadenoma Discharge Exam: Blood pressure 146/92, pulse 96, temperature 98.2 F (36.8 C), temperature source Oral, resp. rate 14, height 5\' 2"  (1.575 m), weight 75.8 kg (167 lb 1.7 oz), last menstrual period 12/28/2014, SpO2 99 %. Neurologically intact  Disposition: Home     Medication List    TAKE these medications        acetaminophen 325 MG tablet  Commonly known as:  TYLENOL  Take 650 mg by mouth every 6 (six) hours as needed for mild pain.     ALPRAZolam 0.25 MG tablet  Commonly known as:  XANAX  Take 0.25 mg by mouth 2 (two) times daily as needed for anxiety.     cephALEXin 500 MG capsule  Commonly known as:  KEFLEX  Take 1 capsule (500 mg total) by mouth 4 (four) times daily.     HYDROcodone-acetaminophen 5-325 MG per tablet  Commonly known as:  NORCO/VICODIN  Take 1-2 tablets by mouth every 4 (four) hours as needed for moderate pain.     hydrocortisone 10 MG tablet  Commonly known as:  CORTEF  Take 1 tablet (10 mg total) by mouth every evening.     hydrocortisone 20 MG tablet  Commonly known as:  CORTEF  Take 1 tablet (20 mg total) by mouth daily.           Follow-up Information    Follow up with Ascencion Dike, MD In 3 days.   Specialty:  Otolaryngology   Contact information:   [address] [place] [address] [phone]       Follow up with Jacelyn Pi, MD.   Specialty:  Endocrinology   Contact information:   [place] [address] [phone]       Follow up with Hosie Spangle, MD.   Specialty:  Neurosurgery   Contact information:   [address] [place] [address] [phone]       Signed: Elaina Hoops 01/01/2015, 8:19 AM

## 2015-01-09 ENCOUNTER — Encounter (HOSPITAL_COMMUNITY): Payer: Self-pay | Admitting: Neurosurgery

## 2015-03-22 ENCOUNTER — Other Ambulatory Visit: Payer: Self-pay | Admitting: Neurosurgery

## 2015-03-22 DIAGNOSIS — D497 Neoplasm of unspecified behavior of endocrine glands and other parts of nervous system: Secondary | ICD-10-CM

## 2015-03-28 ENCOUNTER — Other Ambulatory Visit: Payer: PRIVATE HEALTH INSURANCE

## 2015-03-28 ENCOUNTER — Ambulatory Visit
Admission: RE | Admit: 2015-03-28 | Discharge: 2015-03-28 | Disposition: A | Discharge status: Home / Self Care | Payer: PRIVATE HEALTH INSURANCE | Source: Ambulatory Visit | Attending: Neurosurgery | Admitting: Neurosurgery

## 2015-03-28 DIAGNOSIS — D497 Neoplasm of unspecified behavior of endocrine glands and other parts of nervous system: Secondary | ICD-10-CM

## 2015-03-28 MED ORDER — GADOBENATE DIMEGLUMINE 529 MG/ML IV SOLN
10.0000 mL | Freq: Once | INTRAVENOUS | Status: AC | PRN
  Administered 2015-03-28: 10 mL via INTRAVENOUS

## 2015-04-18 ENCOUNTER — Encounter: Payer: Self-pay | Admitting: Radiation Therapy

## 2015-04-18 ENCOUNTER — Other Ambulatory Visit: Payer: Self-pay | Admitting: Radiation Therapy

## 2015-04-18 DIAGNOSIS — D352 Benign neoplasm of pituitary gland: Secondary | ICD-10-CM

## 2015-04-18 NOTE — Progress Notes (Signed)
1.  Do you need a wheel chair?    no  2. On oxygen?  no  3. Have you ever had any surgery in the body part being scanned?  Yes pituitary done 12/29/14  4. Have you ever had any surgery on your brain or heart?               Pituitary  5. Have you ever had surgery on your eyes or ears?     no            6. Do you have a pacemaker or defibrillator?   no  7. Do you have a Neurostimulator?    no  8. Claustrophobic?  no  9. Any risk for metal in eyes?  no  10. Injury by bullet, buckshot, or shrapnel? no  11. Stent?        no                                                                                                                     12. Hx of Cancer?    no             13. Kidney or Liver disease?  no  14. Hx of Lupus, Rheumatoid Arthritis or Scleroderma? no  15. IV Antibiotics or long term use of NSAIDS?  no  16. HX of Hypertension?  no  17. Diabetes?   no  18. Allergy to contrast?  no  19. Recent labs. Please draw BUN and Creat at the Imaging center prior to [place]

## 2015-07-11 ENCOUNTER — Telehealth: Payer: Self-pay | Admitting: *Deleted

## 2015-07-11 NOTE — Telephone Encounter (Signed)
Leave message for pt to call back 

## 2015-07-11 NOTE — Telephone Encounter (Signed)
-----   Message from Minus Breeding, MD sent at 07/07/2015 11:28 AM EDT ----- Contact the patient and ask if she is again having some kind of surgery.  There was something forwarded from a surgeon but not in the usual ways. If so she has clearance.  Can use my note from earlier this year.  Thanks.  ----- Message -----    From: Vennie Homans    Sent: 07/07/2015   7:50 AM      To: Minus Breeding, MD  Where did you get this, didn't see anything about clearance in pt chart or no message.  ----- Message -----    From: Minus Breeding, MD    Sent: 07/06/2015   4:05 PM      To: Vennie Homans  I think this patient needs preop clearance.  Please check.  She would be cleared.

## 2015-07-13 ENCOUNTER — Telehealth: Payer: Self-pay | Admitting: *Deleted

## 2015-07-13 NOTE — Telephone Encounter (Signed)
-----   Message from Minus Breeding, MD sent at 07/07/2015 11:28 AM EDT ----- Contact the patient and ask if she is again having some kind of surgery.  There was something forwarded from a surgeon but not in the usual ways. If so she has clearance.  Can use my note from earlier this year.  Thanks.  ----- Message -----    From: Vennie Homans    Sent: 07/07/2015   7:50 AM      To: Minus Breeding, MD  Where did you get this, didn't see anything about clearance in pt chart or no message.  ----- Message -----    From: Minus Breeding, MD    Sent: 07/06/2015   4:05 PM      To: Vennie Homans  I think this patient needs preop clearance.  Please check.  She would be cleared.

## 2015-07-13 NOTE — Telephone Encounter (Signed)
Call pt and leave message to call back.

## 2015-07-13 NOTE — Telephone Encounter (Signed)
Leave message for pt to call back 

## 2015-07-19 ENCOUNTER — Ambulatory Visit
Admission: RE | Admit: 2015-07-19 | Discharge: 2015-07-19 | Disposition: A | Discharge status: Home / Self Care | Payer: PRIVATE HEALTH INSURANCE | Source: Ambulatory Visit | Attending: Neurosurgery | Admitting: Neurosurgery

## 2015-07-19 DIAGNOSIS — D352 Benign neoplasm of pituitary gland: Secondary | ICD-10-CM

## 2015-07-19 MED ORDER — GADOBENATE DIMEGLUMINE 529 MG/ML IV SOLN
10.0000 mL | Freq: Once | INTRAVENOUS | Status: AC | PRN
  Administered 2015-07-19: 10 mL via INTRAVENOUS

## 2016-01-03 ENCOUNTER — Other Ambulatory Visit: Payer: Self-pay | Admitting: Neurosurgery

## 2016-01-03 DIAGNOSIS — D497 Neoplasm of unspecified behavior of endocrine glands and other parts of nervous system: Secondary | ICD-10-CM

## 2016-01-05 ENCOUNTER — Ambulatory Visit
Admission: RE | Admit: 2016-01-05 | Discharge: 2016-01-05 | Disposition: A | Discharge status: Home / Self Care | Payer: PRIVATE HEALTH INSURANCE | Source: Ambulatory Visit | Attending: Neurosurgery | Admitting: Neurosurgery

## 2016-01-05 DIAGNOSIS — D497 Neoplasm of unspecified behavior of endocrine glands and other parts of nervous system: Secondary | ICD-10-CM

## 2016-01-05 MED ORDER — GADOBENATE DIMEGLUMINE 529 MG/ML IV SOLN
7.0000 mL | Freq: Once | INTRAVENOUS | Status: AC | PRN
  Administered 2016-01-05: 7 mL via INTRAVENOUS

## 2016-09-27 ENCOUNTER — Other Ambulatory Visit: Payer: Self-pay | Admitting: Neurosurgery

## 2016-09-27 DIAGNOSIS — D497 Neoplasm of unspecified behavior of endocrine glands and other parts of nervous system: Secondary | ICD-10-CM

## 2016-10-22 ENCOUNTER — Other Ambulatory Visit: Payer: PRIVATE HEALTH INSURANCE

## 2016-10-23 ENCOUNTER — Ambulatory Visit
Admission: RE | Admit: 2016-10-23 | Discharge: 2016-10-23 | Disposition: A | Discharge status: Home / Self Care | Payer: PRIVATE HEALTH INSURANCE | Source: Ambulatory Visit | Attending: Neurosurgery | Admitting: Neurosurgery

## 2016-10-23 DIAGNOSIS — D497 Neoplasm of unspecified behavior of endocrine glands and other parts of nervous system: Secondary | ICD-10-CM

## 2016-10-23 MED ORDER — GADOBENATE DIMEGLUMINE 529 MG/ML IV SOLN
7.0000 mL | Freq: Once | INTRAVENOUS | Status: AC | PRN
  Administered 2016-10-23: 7 mL via INTRAVENOUS

## 2017-02-11 ENCOUNTER — Ambulatory Visit (INDEPENDENT_AMBULATORY_CARE_PROVIDER_SITE_OTHER): Payer: PRIVATE HEALTH INSURANCE

## 2017-02-11 ENCOUNTER — Ambulatory Visit (INDEPENDENT_AMBULATORY_CARE_PROVIDER_SITE_OTHER): Payer: PRIVATE HEALTH INSURANCE | Admitting: Orthopaedic Surgery

## 2017-02-11 ENCOUNTER — Encounter (INDEPENDENT_AMBULATORY_CARE_PROVIDER_SITE_OTHER): Payer: Self-pay | Admitting: Orthopaedic Surgery

## 2017-02-11 VITALS — BP 153/99 | HR 74 | Ht 62.0 in | Wt 171.0 lb

## 2017-02-11 DIAGNOSIS — M545 Low back pain: Secondary | ICD-10-CM | POA: Diagnosis not present

## 2017-02-11 MED ORDER — PREDNISONE 5 MG PO TABS: 5.0000 mg | ORAL_TABLET | Freq: Every day | ORAL | 0 refills | Status: DC

## 2017-02-11 NOTE — Progress Notes (Signed)
Office Visit Note   Patient: Kim Guerra           Date of Birth: 28-Feb-1966           MRN: [id number] Visit Date: 02/11/2017              Requested by: No referring provider defined for this encounter. PCP: Patient, No Pcp Per   Assessment & Plan: Visit Diagnoses:  1. Acute bilateral low back pain, with sciatica presence unspecified            With some lumbar spasms.  Plan: We'll place her on prednisone 5 mg Dosepak. She can discontinue senna she gets some relief if she still having problems in 2 weeks she will call. If she develops radicular symptoms that increase or weakness she will call. His office follow-up when necessary  Follow-Up Instructions: Return if symptoms worsen or fail to improve.   Orders:  Orders Placed This Encounter  Procedures  . XR Lumbar Spine 2-3 Views   Meds ordered this encounter  Medications  . predniSONE (DELTASONE) 5 MG tablet    Sig: Take 1 tablet (5 mg total) by mouth daily with breakfast.    Dispense:  21 tablet    Refill:  0      Procedures: No procedures performed   Clinical Data: No additional findings.   Subjective: Chief Complaint  Patient presents with  . Lower Back - Pain    HPI patient has onset of back pain since she was doing some work in the attic which she states is been severe. Said 2 other episodes last 20 years and usually gets better in a short period of time. 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 She denies any associated bowel or bladder symptoms significant back pain with turning twisting changing positions. She had pain that radiates into both calfs. She was cleaning out an attic and was doing a lot of turning and kneeling. She denies fever chills. His back surgery.  Review of Systems positive for hypertension. 2 normal deliveries one ectopic. Benign pituitary tumor removal she was on some cortisone for a few months after that was been  off of it. He is systems positive for anxiety bronchitis history of hypertension in the pituitary tumor. 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 Otherwise 14 point system is negative.   Objective: Vital Signs: BP (!) 153/99   Pulse 74   Ht 5\' 2"  (1.575 m)   Wt 171 lb (77.6 kg)   BMI 31.28 kg/m   Physical Exam  Constitutional: She is oriented to person, place, and time. She appears well-developed.  HENT:  Head: Normocephalic.  Right Ear: External ear normal.  Left Ear: External ear normal.  Eyes: Pupils are equal, round, and reactive to light.  Neck: No tracheal deviation present. No thyromegaly present.  Cardiovascular: Normal rate.   Pulmonary/Chest: Effort normal.  Abdominal: Soft.  Musculoskeletal:  Patient has some tenderness lumbosacral junction. Some pain with straight leg raising at 90 some pain with positive compression test. Ankle jerk 1+ and symmetrical. No pain with hip range of motion negative Corky Sox. Negative Homan. Distal pulses are intact. No trochanteric bursal tenderness mild sciatic notch tenderness. Quads hamstrings dorsiflexors are normal for a slow short stride 9 inch stride gait. She gets slowly from sitting standing using the arms of the chair. No midline defects. No tenderness upper lumbar region.  Neurological: She is alert  and oriented to person, place, and time.  Skin: Skin is warm and dry.  Psychiatric: She has a normal mood and affect. Her behavior is normal.    Ortho Exam  Specialty Comments:  No specialty comments available.  Imaging: Xr Lumbar Spine 2-3 Views  Result Date: 02/11/2017 AP lateral lumbar spine x-rays are obtained this shows the sacralization L5-S1. No spondylolisthesis mild facet changes at L5-S1 and at L4-5. Normal lumbar curvature. Negative for acute changes. Impression: Lumbar facet sclerosis. No significant disc space narrowing no anterolisthesis. Negative for acute fracture.    PMFS History: Patient Active Problem  List   Diagnosis Date Noted  . Pituitary macroadenoma with extrasellar extension ([place]) 12/29/2014  . Abnormal EKG 11/04/2014  . Dyspnea 11/04/2014  . Essential hypertension 11/04/2014   Past Medical History:  Diagnosis Date  . Anemia   . Dysrhythmia    pvc's  . Pituitary macroadenoma ([place])   . PONV (postoperative nausea and vomiting)     Family History  Problem Relation Age of Onset  . Lung cancer Father 43  . Sarcoidosis Sister   . Lung cancer Brother 24    Past Surgical History:  Procedure Laterality Date  . CRANIOTOMY N/A 12/29/2014   Procedure: CRANIOTOMY HYPOPHYSECTOMY TRANSNASAL APPROACH;  Surgeon: Jovita Gamma, MD;  Location: [facility];  Service: Neurosurgery;  Laterality: N/A;  Transphenoidal resection of pituitary tumor harvesting of abdominal fat graft  . ECTOPIC PREGNANCY SURGERY    . TRANSNASAL APPROACH N/A 12/29/2014   Procedure: TRANSPHENOIDAL PITUITARY RESECTION WITH IMAGE GUIDANCE;  Surgeon: Leta Baptist, MD;  Location: [facility];  Service: ENT;  Laterality: N/A;  . WISDOM TOOTH EXTRACTION     Social History   Occupational History  . Not on file.   Social History Main Topics  . Smoking status: Never Smoker  . Smokeless tobacco: Never Used  . Alcohol use No  . Drug use: No  . Sexual activity: Not on file

## 2017-02-27 ENCOUNTER — Encounter: Payer: Self-pay | Admitting: Endocrinology

## 2017-07-14 ENCOUNTER — Other Ambulatory Visit: Payer: Self-pay | Admitting: Neurosurgery

## 2017-07-14 DIAGNOSIS — D497 Neoplasm of unspecified behavior of endocrine glands and other parts of nervous system: Secondary | ICD-10-CM

## 2017-07-26 ENCOUNTER — Ambulatory Visit
Admission: RE | Admit: 2017-07-26 | Discharge: 2017-07-26 | Disposition: A | Discharge status: Home / Self Care | Payer: PRIVATE HEALTH INSURANCE | Source: Ambulatory Visit | Attending: Neurosurgery | Admitting: Neurosurgery

## 2017-07-26 ENCOUNTER — Other Ambulatory Visit: Payer: PRIVATE HEALTH INSURANCE

## 2017-07-26 DIAGNOSIS — D497 Neoplasm of unspecified behavior of endocrine glands and other parts of nervous system: Secondary | ICD-10-CM

## 2017-07-26 MED ORDER — GADOBENATE DIMEGLUMINE 529 MG/ML IV SOLN
8.0000 mL | Freq: Once | INTRAVENOUS | Status: AC | PRN
  Administered 2017-07-26: 8 mL via INTRAVENOUS

## 2019-02-16 ENCOUNTER — Other Ambulatory Visit: Payer: Self-pay | Admitting: Neurosurgery

## 2019-02-16 DIAGNOSIS — D497 Neoplasm of unspecified behavior of endocrine glands and other parts of nervous system: Secondary | ICD-10-CM

## 2019-03-12 ENCOUNTER — Ambulatory Visit
Admission: RE | Admit: 2019-03-12 | Discharge: 2019-03-12 | Disposition: A | Discharge status: Home / Self Care | Payer: PRIVATE HEALTH INSURANCE | Source: Ambulatory Visit | Attending: Neurosurgery | Admitting: Neurosurgery

## 2019-03-12 ENCOUNTER — Other Ambulatory Visit: Payer: Self-pay

## 2019-03-12 DIAGNOSIS — D497 Neoplasm of unspecified behavior of endocrine glands and other parts of nervous system: Secondary | ICD-10-CM

## 2019-03-12 MED ORDER — GADOBENATE DIMEGLUMINE 529 MG/ML IV SOLN
7.0000 mL | Freq: Once | INTRAVENOUS | Status: AC | PRN
  Administered 2019-03-12: 7 mL via INTRAVENOUS

## 2019-08-26 ENCOUNTER — Ambulatory Visit (INDEPENDENT_AMBULATORY_CARE_PROVIDER_SITE_OTHER): Payer: PRIVATE HEALTH INSURANCE | Admitting: Otolaryngology

## 2019-09-29 DIAGNOSIS — H6123 Impacted cerumen, bilateral: Secondary | ICD-10-CM | POA: Diagnosis not present

## 2019-09-29 DIAGNOSIS — H900 Conductive hearing loss, bilateral: Secondary | ICD-10-CM | POA: Diagnosis not present

## 2019-10-14 DIAGNOSIS — H5213 Myopia, bilateral: Secondary | ICD-10-CM | POA: Diagnosis not present

## 2019-10-14 DIAGNOSIS — D352 Benign neoplasm of pituitary gland: Secondary | ICD-10-CM | POA: Diagnosis not present

## 2019-11-19 ENCOUNTER — Ambulatory Visit: Payer: Self-pay

## 2019-11-19 ENCOUNTER — Ambulatory Visit: Payer: PRIVATE HEALTH INSURANCE | Attending: Internal Medicine

## 2019-11-19 DIAGNOSIS — Z23 Encounter for immunization: Secondary | ICD-10-CM | POA: Insufficient documentation

## 2019-11-19 NOTE — Progress Notes (Signed)
   Covid-19 Vaccination Clinic  Name:  Shiasia Correnti    MRN: [id number] DOB: 1966/08/06  11/19/2019  Ms. Krzywicki was observed post Covid-19 immunization for 15 minutes without incident. She was provided with Vaccine Information Sheet and instruction to access the V-Safe system.   Ms. Riggan was instructed to call 911 with any severe reactions post vaccine: Marland Kitchen Difficulty breathing  . Swelling of face and throat  . A fast heartbeat  . A bad rash all over body  . Dizziness and weakness   Immunizations Administered    Name Date Dose VIS Date Route   Pfizer COVID-19 Vaccine 11/19/2019  2:36 PM 0.3 mL 08/27/2019 Intramuscular   Manufacturer: [place]   Lot: [id number]   [place]: [id number]

## 2019-12-15 ENCOUNTER — Ambulatory Visit: Payer: PRIVATE HEALTH INSURANCE | Attending: Internal Medicine

## 2019-12-15 DIAGNOSIS — Z23 Encounter for immunization: Secondary | ICD-10-CM

## 2019-12-15 NOTE — Progress Notes (Signed)
   Covid-19 Vaccination Clinic  Name:  Tylin Sagert    MRN: [id number] DOB: Nov 30, 1965  12/15/2019  Ms. Froehlich was observed post Covid-19 immunization for 15 minutes without incident. She was provided with Vaccine Information Sheet and instruction to access the V-Safe system.   Ms. Valerie was instructed to call 911 with any severe reactions post vaccine: Marland Kitchen Difficulty breathing  . Swelling of face and throat  . A fast heartbeat  . A bad rash all over body  . Dizziness and weakness   Immunizations Administered    Name Date Dose VIS Date Route   Pfizer COVID-19 Vaccine 12/15/2019  2:46 PM 0.3 mL 08/27/2019 Intramuscular   Manufacturer: [place]   Lot: [id number]   [place]: [id number]

## 2020-02-08 DIAGNOSIS — E220 Acromegaly and pituitary gigantism: Secondary | ICD-10-CM | POA: Diagnosis not present

## 2020-02-08 DIAGNOSIS — M8580 Other specified disorders of bone density and structure, unspecified site: Secondary | ICD-10-CM | POA: Diagnosis not present

## 2020-02-08 DIAGNOSIS — E559 Vitamin D deficiency, unspecified: Secondary | ICD-10-CM | POA: Diagnosis not present

## 2020-02-08 DIAGNOSIS — I10 Essential (primary) hypertension: Secondary | ICD-10-CM | POA: Diagnosis not present

## 2020-02-08 DIAGNOSIS — D443 Neoplasm of uncertain behavior of pituitary gland: Secondary | ICD-10-CM | POA: Diagnosis not present

## 2020-03-27 ENCOUNTER — Ambulatory Visit (INDEPENDENT_AMBULATORY_CARE_PROVIDER_SITE_OTHER): Payer: BC Managed Care – PPO | Admitting: Physician Assistant

## 2020-03-27 ENCOUNTER — Other Ambulatory Visit: Payer: Self-pay

## 2020-03-27 ENCOUNTER — Encounter: Payer: Self-pay | Admitting: Physician Assistant

## 2020-03-27 DIAGNOSIS — G5762 Lesion of plantar nerve, left lower limb: Secondary | ICD-10-CM | POA: Diagnosis not present

## 2020-03-27 DIAGNOSIS — M722 Plantar fascial fibromatosis: Secondary | ICD-10-CM | POA: Diagnosis not present

## 2020-03-27 NOTE — Progress Notes (Signed)
Office Visit Note   Patient: Kim Guerra           Date of Birth: 1966-05-25           MRN: [id number] Visit Date: 03/27/2020              Requested by: Kim Guerra, [address] [place],  [place] [address] PCP: Kim Pi, MD   Assessment & Plan: Visit Diagnoses:  1. Plantar fasciitis of left foot   2. Morton's neuroma of left foot     Plan: Discussed with her at length proper shoe wear she should avoid open back shoes and also avoid narrow toe boxes.  Also suggested she change her shoes out daily.  Recommend she apply Voltaren gel no frequent left foot at medial tubercle calcaneus and left third webspace.  She may benefit from a left heel injection in the future.  Gastrocsoleus stretching exercises were shown today.  Questions were encouraged and answered.  Follow-Up Instructions: Return if symptoms worsen or fail to improve.   Orders:  No orders of the defined types were placed in this encounter.  No orders of the defined types were placed in this encounter.     Procedures: No procedures performed   Clinical Data: No additional findings.   Subjective: Chief Complaint  Patient presents with  . Left Foot - Pain    HPI Kim Guerra is 54 year old female seen for the first time for left foot pain is been ongoing for about a year.  She states that she made the appointment 2 weeks ago but now is about 80% better.  She is having left heel pain.  Pain can be 10 out of 10 pain worse.  She states most the time her pain is been 6 out of 10 pain.  Pain involves the left heel and Achilles region.  She has tried a rolling tennis ball with midfoot changing shoes, ice, ankle brace, heel pads and none of these are really helped.  She did try some pineapple meant water and she feels that since doing that she has had increased pain in the left heel.  She also notes that she has sensation of a pebble being in their shoe and has been diagnosed with a Morton's  neuroma left foot has questions about this.  Reports 1 injection at the third webspace of the left foot by another provider which did help some. Discussed with her shoewear she does wear a lot of open back shoes.  She has had no known injury to the left foot.  Review of Systems Denies fevers chills or chest pain  Objective: Vital Signs: There were no vitals taken for this visit.  Physical Exam Constitutional:      Appearance: She is not ill-appearing or diaphoretic.  Pulmonary:     Effort: Pulmonary effort is normal.  Neurological:     Mental Status: She is alert and oriented to person, place, and time.  Psychiatric:        Mood and Affect: Mood normal.     Ortho Exam Left foot tenderness over the medial tubercle of the calcaneus.  Slight tenderness over the right medial tubercle of the calcaneus.  Achilles intact bilateral and nontender.  Nontender posterior tibial tendons peroneal tendons bilaterally.  Out of 5 strength with inversion eversion against resistance bilaterally.  Dorsal pedal pulses are 2+ bilaterally sensation grossly intact bilateral feet.  Tight gastrocs bilaterally.  No rashes skin lesions gross deformities of the foot.  Good dorsiflexion plantarflexion bilateral ankles.  Specialty Comments:  No specialty comments available.  Imaging: No results found.   PMFS History: Patient Active Problem List   Diagnosis Date Noted  . Pituitary macroadenoma with extrasellar extension ([place]) 12/29/2014  . Abnormal EKG 11/04/2014  . Dyspnea 11/04/2014  . Essential hypertension 11/04/2014   Past Medical History:  Diagnosis Date  . Anemia   . Dysrhythmia    pvc's  . Pituitary macroadenoma ([place])   . PONV (postoperative nausea and vomiting)     Family History  Problem Relation Age of Onset  . Lung cancer Father 49  . Sarcoidosis Sister   . Lung cancer Brother 48    Past Surgical History:  Procedure Laterality Date  . CRANIOTOMY N/A 12/29/2014   Procedure:  CRANIOTOMY HYPOPHYSECTOMY TRANSNASAL APPROACH;  Surgeon: Kim Gamma, MD;  Location: [facility];  Service: Neurosurgery;  Laterality: N/A;  Transphenoidal resection of pituitary tumor harvesting of abdominal fat graft  . ECTOPIC PREGNANCY SURGERY    . TRANSNASAL APPROACH N/A 12/29/2014   Procedure: TRANSPHENOIDAL PITUITARY RESECTION WITH IMAGE GUIDANCE;  Surgeon: Kim Baptist, MD;  Location: [facility];  Service: ENT;  Laterality: N/A;  . WISDOM TOOTH EXTRACTION     Social History   Occupational History  . Not on file  Tobacco Use  . Smoking status: Never Smoker  . Smokeless tobacco: Never Used  Substance and Sexual Activity  . Alcohol use: No    Alcohol/week: 0.0 standard drinks  . Drug use: No  . Sexual activity: Not on file

## 2020-10-06 DIAGNOSIS — Z01419 Encounter for gynecological examination (general) (routine) without abnormal findings: Secondary | ICD-10-CM | POA: Diagnosis not present

## 2020-10-06 DIAGNOSIS — N952 Postmenopausal atrophic vaginitis: Secondary | ICD-10-CM | POA: Diagnosis not present

## 2020-10-06 DIAGNOSIS — Z13820 Encounter for screening for osteoporosis: Secondary | ICD-10-CM | POA: Diagnosis not present

## 2020-10-06 DIAGNOSIS — Z6831 Body mass index (BMI) 31.0-31.9, adult: Secondary | ICD-10-CM | POA: Diagnosis not present

## 2020-10-06 DIAGNOSIS — M810 Age-related osteoporosis without current pathological fracture: Secondary | ICD-10-CM | POA: Diagnosis not present

## 2020-10-26 DIAGNOSIS — Z1231 Encounter for screening mammogram for malignant neoplasm of breast: Secondary | ICD-10-CM | POA: Diagnosis not present

## 2020-11-29 DIAGNOSIS — H6123 Impacted cerumen, bilateral: Secondary | ICD-10-CM | POA: Diagnosis not present

## 2021-01-31 DIAGNOSIS — E221 Hyperprolactinemia: Secondary | ICD-10-CM | POA: Diagnosis not present

## 2021-01-31 DIAGNOSIS — D443 Neoplasm of uncertain behavior of pituitary gland: Secondary | ICD-10-CM | POA: Diagnosis not present

## 2021-01-31 DIAGNOSIS — I10 Essential (primary) hypertension: Secondary | ICD-10-CM | POA: Diagnosis not present

## 2021-01-31 DIAGNOSIS — E559 Vitamin D deficiency, unspecified: Secondary | ICD-10-CM | POA: Diagnosis not present

## 2021-02-07 DIAGNOSIS — E220 Acromegaly and pituitary gigantism: Secondary | ICD-10-CM | POA: Diagnosis not present

## 2021-02-07 DIAGNOSIS — I10 Essential (primary) hypertension: Secondary | ICD-10-CM | POA: Diagnosis not present

## 2021-02-07 DIAGNOSIS — M8580 Other specified disorders of bone density and structure, unspecified site: Secondary | ICD-10-CM | POA: Diagnosis not present

## 2021-02-07 DIAGNOSIS — D443 Neoplasm of uncertain behavior of pituitary gland: Secondary | ICD-10-CM | POA: Diagnosis not present

## 2021-03-08 DIAGNOSIS — R21 Rash and other nonspecific skin eruption: Secondary | ICD-10-CM | POA: Diagnosis not present

## 2021-05-09 DIAGNOSIS — Z20822 Contact with and (suspected) exposure to covid-19: Secondary | ICD-10-CM | POA: Diagnosis not present

## 2021-05-15 DIAGNOSIS — R0982 Postnasal drip: Secondary | ICD-10-CM | POA: Diagnosis not present

## 2021-07-02 DIAGNOSIS — H6123 Impacted cerumen, bilateral: Secondary | ICD-10-CM | POA: Diagnosis not present

## 2021-10-29 DIAGNOSIS — Z124 Encounter for screening for malignant neoplasm of cervix: Secondary | ICD-10-CM | POA: Diagnosis not present

## 2021-10-29 DIAGNOSIS — Z1231 Encounter for screening mammogram for malignant neoplasm of breast: Secondary | ICD-10-CM | POA: Diagnosis not present

## 2021-10-29 DIAGNOSIS — Z6831 Body mass index (BMI) 31.0-31.9, adult: Secondary | ICD-10-CM | POA: Diagnosis not present

## 2021-10-29 DIAGNOSIS — Z01419 Encounter for gynecological examination (general) (routine) without abnormal findings: Secondary | ICD-10-CM | POA: Diagnosis not present

## 2022-01-07 DIAGNOSIS — D497 Neoplasm of unspecified behavior of endocrine glands and other parts of nervous system: Secondary | ICD-10-CM | POA: Diagnosis not present

## 2022-01-07 DIAGNOSIS — Z6832 Body mass index (BMI) 32.0-32.9, adult: Secondary | ICD-10-CM | POA: Diagnosis not present

## 2022-01-07 DIAGNOSIS — E220 Acromegaly and pituitary gigantism: Secondary | ICD-10-CM | POA: Diagnosis not present

## 2022-01-10 ENCOUNTER — Other Ambulatory Visit: Payer: Self-pay | Admitting: Neurosurgery

## 2022-01-10 DIAGNOSIS — D497 Neoplasm of unspecified behavior of endocrine glands and other parts of nervous system: Secondary | ICD-10-CM

## 2022-01-20 ENCOUNTER — Ambulatory Visit
Admission: RE | Admit: 2022-01-20 | Discharge: 2022-01-20 | Disposition: A | Discharge status: Home / Self Care | Payer: PRIVATE HEALTH INSURANCE | Source: Ambulatory Visit | Attending: Neurosurgery | Admitting: Neurosurgery

## 2022-01-20 DIAGNOSIS — D497 Neoplasm of unspecified behavior of endocrine glands and other parts of nervous system: Secondary | ICD-10-CM

## 2022-01-20 MED ORDER — GADOBENATE DIMEGLUMINE 529 MG/ML IV SOLN
8.0000 mL | Freq: Once | INTRAVENOUS | Status: AC | PRN
  Administered 2022-01-20: 8 mL via INTRAVENOUS

## 2022-01-23 DIAGNOSIS — H6123 Impacted cerumen, bilateral: Secondary | ICD-10-CM | POA: Diagnosis not present

## 2022-01-25 ENCOUNTER — Ambulatory Visit (INDEPENDENT_AMBULATORY_CARE_PROVIDER_SITE_OTHER): Payer: BC Managed Care – PPO

## 2022-01-25 ENCOUNTER — Ambulatory Visit: Payer: BC Managed Care – PPO | Admitting: Orthopedic Surgery

## 2022-01-25 ENCOUNTER — Encounter: Payer: Self-pay | Admitting: Orthopedic Surgery

## 2022-01-25 DIAGNOSIS — M545 Low back pain, unspecified: Secondary | ICD-10-CM | POA: Diagnosis not present

## 2022-01-25 MED ORDER — PREDNISONE 5 MG (21) PO TBPK: ORAL_TABLET | ORAL | 0 refills | Status: DC

## 2022-01-25 MED ORDER — METHOCARBAMOL 500 MG PO TABS: 500.0000 mg | ORAL_TABLET | Freq: Three times a day (TID) | ORAL | 0 refills | Status: DC | PRN

## 2022-01-25 NOTE — Progress Notes (Signed)
? ?Office Visit Note ?  ?Patient: Kim Guerra           ?Date of Birth: [birth year]           ?MRN: [id number] ?Visit Date: 01/25/2022 ?Requested by: Jacelyn Pi, MD ?[place],  [place] [address] ?PCP: Jacelyn Pi, MD ? ?Subjective: ?Chief Complaint  ?Patient presents with  ? Lower Back - Pain  ? ? ?HPI: Kim Guerra is a 56 year old patient with history of on and off sciatica.  This morning she bent over to get the iron off of the floor and felt a significant twinge in her lower back.  They have heard some radicular pain down the left greater than right leg.  Denies any numbness and tingling.  Has had no prior surgery.  Ice pack helped.  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  Walking actually made it a little bit worse along with coughing and sneezing.  She did have some low back pain with pregnancy and had an episode of sciatica late last year.  Denies any saddle paresthesias. ?             ?ROS: All systems reviewed are negative as they relate to the chief complaint within the history of present illness.  Patient denies  fevers or chills. ? ? ?Assessment & Plan: ?Visit Diagnoses:  ?1. Low back pain, unspecified back pain laterality, unspecified chronicity, unspecified whether sciatica present   ? ? ?Plan: Impression is low back pain which sounds like it may be a bulging disc centrally or slightly to the left.  Plan is Robaxin and Medrol Dosepak.  Avoid heavy lifting.  Walking for exercise okay.  Stretching encouraged.  If her symptoms worsen based on the recurrent nature of the sciatica I would favor MRI scan to allow for epidural steroid injections to proceed.  Follow-up as needed. ? ?Follow-Up Instructions: No follow-ups on file.  ? ?Orders:  ?Orders Placed This Encounter  ?Procedures  ? XR Lumbar Spine 2-3 Views  ? ?Meds ordered this encounter  ?Medications  ? predniSONE (STERAPRED UNI-PAK 21 TAB) 5 MG (21) TBPK tablet  ?  Sig: Take dosepak as directed  ?  Dispense:  21  tablet  ?  Refill:  0  ? methocarbamol (ROBAXIN) 500 MG tablet  ?  Sig: Take 1 tablet (500 mg total) by mouth every 8 (eight) hours as needed for muscle spasms.  ?  Dispense:  30 tablet  ?  Refill:  0  ? ? ? ? Procedures: ?No procedures performed ? ? ?Clinical Data: ?No additional findings. ? ?Objective: ?Vital Signs: There were no vitals taken for this visit. ? ?Physical Exam:  ? ?Constitutional: Patient appears well-developed ?HEENT:  ?Head: Normocephalic ?Eyes:EOM are normal ?Neck: Normal range of motion ?Cardiovascular: Normal rate ?Pulmonary/chest: Effort normal ?Neurologic: Patient is alert ?Skin: Skin is warm ?Psychiatric: Patient has normal mood and affect ? ? ?Ortho Exam: Ortho exam demonstrates full active and passive range of motion of the knees hips and ankles.  No nerve root tension signs today.  No paresthesias L1-S1 bilaterally.  5 out of 5 ankle dorsiflexion plantarflexion quad and hamstring strength with no trochanteric tenderness noted.  Some pain with forward lateral bending.  No other masses lymphadenopathy or skin changes noted in the back region.  Medrol Dosepak and muscle relaxer sent in for symptomatic relief ? ?Specialty Comments:  ?No specialty comments available. ? ?Imaging: ?No results found. ? ? ?PMFS History: ?Patient Active Problem List  ?  Diagnosis Date Noted  ? 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 Abnormal EKG 11/04/2014  ? Dyspnea 11/04/2014  ? Essential hypertension 11/04/2014  ? ?Past Medical History:  ?Diagnosis Date  ? Anemia   ? Dysrhythmia   ? pvc's  ? Pituitary macroadenoma ([facility])   ? PONV (postoperative nausea and vomiting)   ?  ?Family History  ?Problem Relation Age of Onset  ? Lung cancer Father 1  ? Sarcoidosis Sister   ? Lung cancer Brother 88  ?  ?Past Surgical History:  ?Procedure Laterality Date  ? CRANIOTOMY N/A 12/29/2014  ? Procedure: CRANIOTOMY HYPOPHYSECTOMY TRANSNASAL APPROACH;  Surgeon: Jovita Gamma, MD;  Location: [facility];  Service: Neurosurgery;  Laterality: N/A;  Transphenoidal resection of pituitary tumor harvesting of abdominal fat graft  ? ECTOPIC PREGNANCY SURGERY    ? TRANSNASAL APPROACH N/A 12/29/2014  ? Procedure: TRANSPHENOIDAL PITUITARY RESECTION WITH IMAGE GUIDANCE;  Surgeon: Leta Baptist, MD;  Location: [facility];  Service: ENT;  Laterality: N/A;  ? WISDOM TOOTH EXTRACTION    ? ?Social History  ? ?Occupational History  ? Not on file  ?Tobacco Use  ? Smoking status: Never  ? Smokeless tobacco: Never  ?Substance and Sexual Activity  ? Alcohol use: No  ?  Alcohol/week: 0.0 standard drinks  ? Drug use: No  ? Sexual activity: Not on file  ? ? ? ? ? ?

## 2022-02-04 DIAGNOSIS — D497 Neoplasm of unspecified behavior of endocrine glands and other parts of nervous system: Secondary | ICD-10-CM | POA: Diagnosis not present

## 2022-03-13 DIAGNOSIS — I10 Essential (primary) hypertension: Secondary | ICD-10-CM | POA: Diagnosis not present

## 2022-03-13 DIAGNOSIS — E559 Vitamin D deficiency, unspecified: Secondary | ICD-10-CM | POA: Diagnosis not present

## 2022-03-13 DIAGNOSIS — E221 Hyperprolactinemia: Secondary | ICD-10-CM | POA: Diagnosis not present

## 2022-03-13 DIAGNOSIS — D443 Neoplasm of uncertain behavior of pituitary gland: Secondary | ICD-10-CM | POA: Diagnosis not present

## 2022-03-18 DIAGNOSIS — D443 Neoplasm of uncertain behavior of pituitary gland: Secondary | ICD-10-CM | POA: Diagnosis not present

## 2022-03-18 DIAGNOSIS — I10 Essential (primary) hypertension: Secondary | ICD-10-CM | POA: Diagnosis not present

## 2022-03-18 DIAGNOSIS — E220 Acromegaly and pituitary gigantism: Secondary | ICD-10-CM | POA: Diagnosis not present

## 2022-03-18 DIAGNOSIS — E785 Hyperlipidemia, unspecified: Secondary | ICD-10-CM | POA: Diagnosis not present

## 2022-09-02 IMAGING — MR MR HEAD WO/W CM
13 of 20 series · 34 of 48 positions shown · IV contrast (8ML MULTIHANCE)
Comparison: 03/12/2019

CLINICAL DATA: Pituitary tumor removed in 0417, follow-up

EXAM:
MRI HEAD WITHOUT AND WITH CONTRAST
TECHNIQUE: Multiplanar, multiecho pulse sequences of the brain and surrounding
structures were obtained without and with intravenous contrast.
CONTRAST:  8mL MULTIHANCE GADOBENATE DIMEGLUMINE 529 MG/ML IV SOLN

[Series 2: T1 · sagittal · 5.0mm · 0.49mm/px · 3 of 25 slices shown (1 of 3)]
[im 1/25]
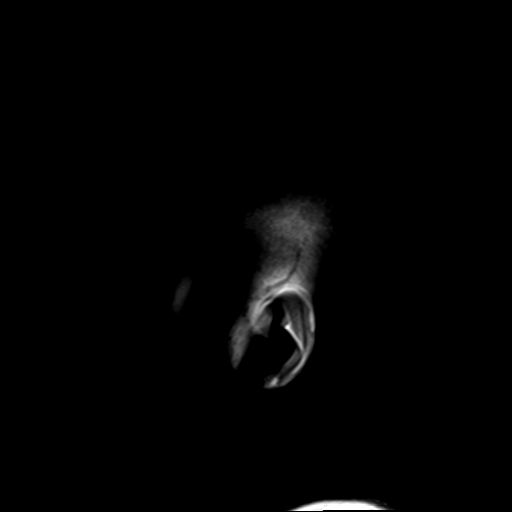
[im 13/25]
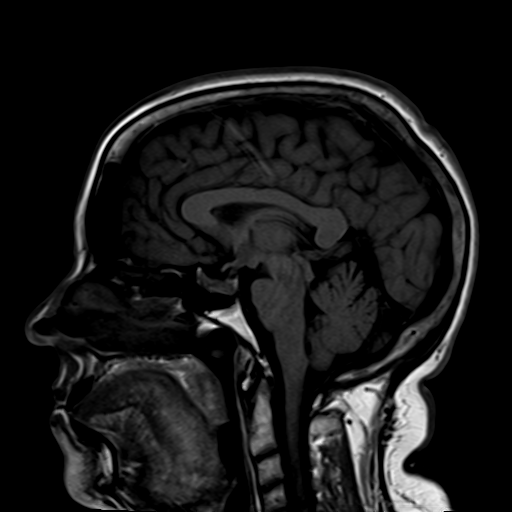
[im 25/25]
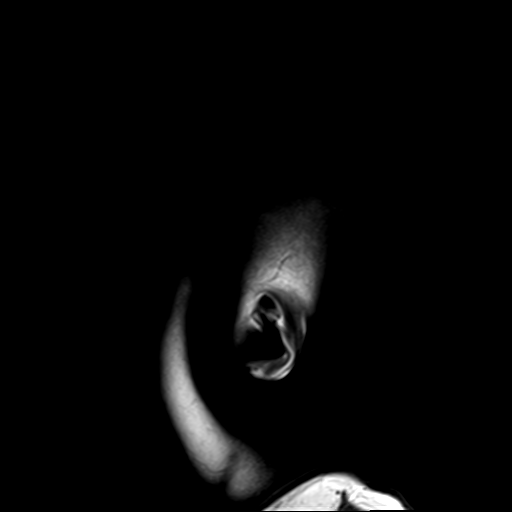

[Series 3: DWI · axial · 3.0mm · 1.88mm/px · z∈[-49,+99]mm · 8 of 102 slices shown]
[im 1/102]
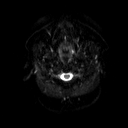
[im 12/102]
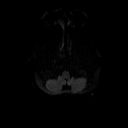
[im 34/102]
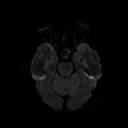
[im 45/102]
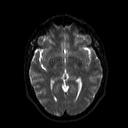
[im 57/102]
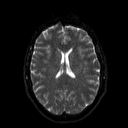
[im 68/102]
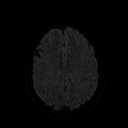
[im 90/102]
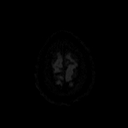
[im 102/102]
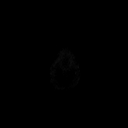

[Series 4: dwi_adc · axial · 3.0mm · 1.88mm/px · z∈[-49,+99]mm · 6 of 51 slices shown]
[im 1/51]
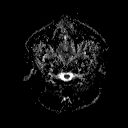
[im 11/51]
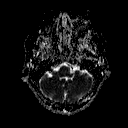
[im 21/51]
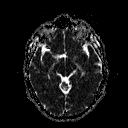
[im 31/51]
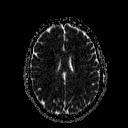
[im 41/51]
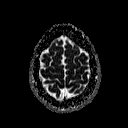
[im 51/51]
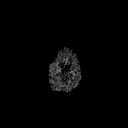

[Series 5: T2 · axial · 5.0mm · 0.36mm/px · z∈[-49,+99]mm · 2 of 24 slices shown (1 of 3)]
[im 1/24]
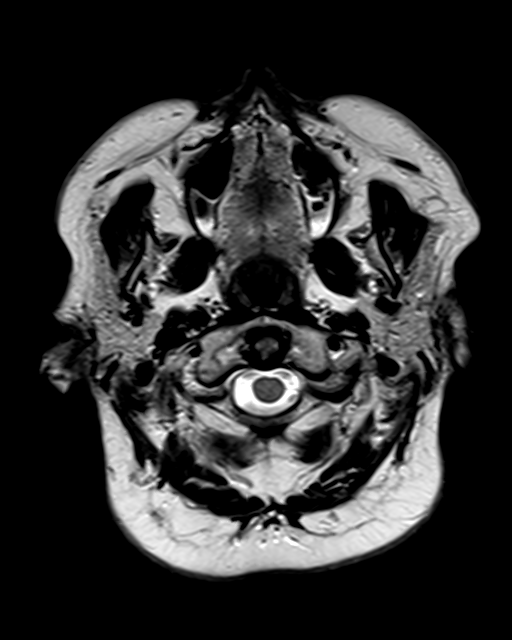
[im 24/24]
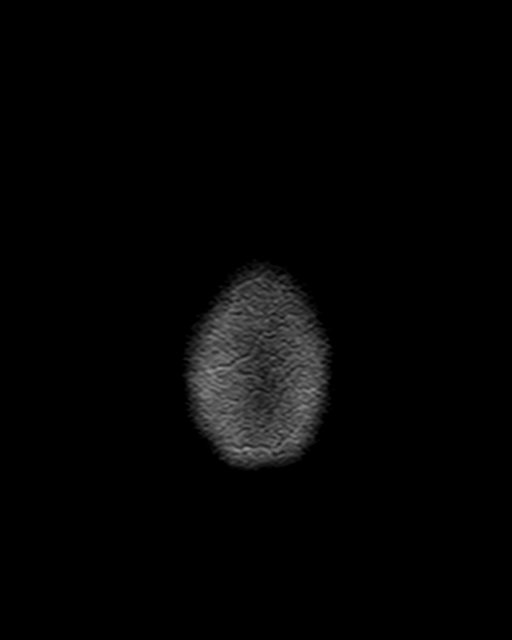

[Series 6: FLAIR · axial · 3.0mm · 0.45mm/px · z∈[-49,+98]mm · 3 of 33 slices shown]
[im 1/33]
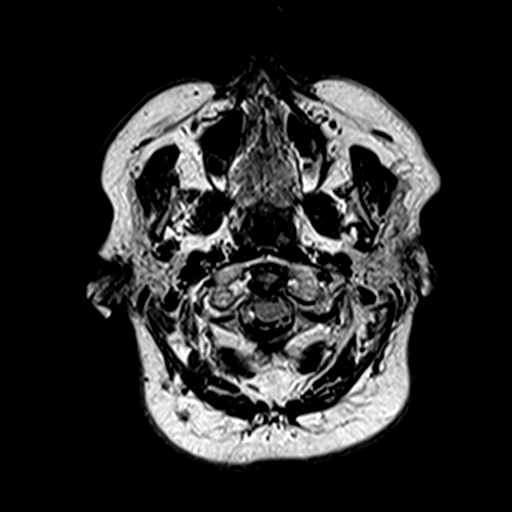
[im 17/33]
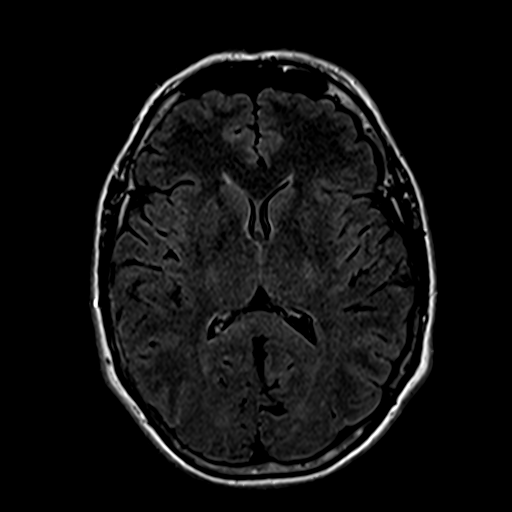
[im 33/33]
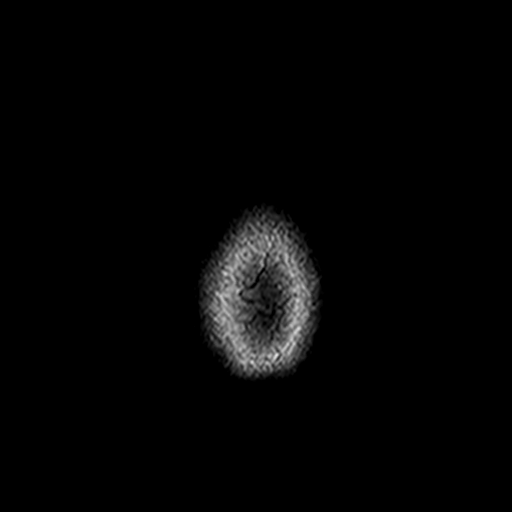

[Series 7: T2 · coronal · 5.0mm · 0.45mm/px · 2 of 28 slices shown (2 of 3)]
[im 1/28]
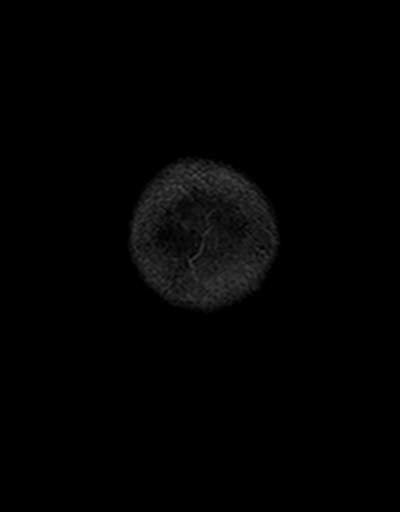
[im 28/28]
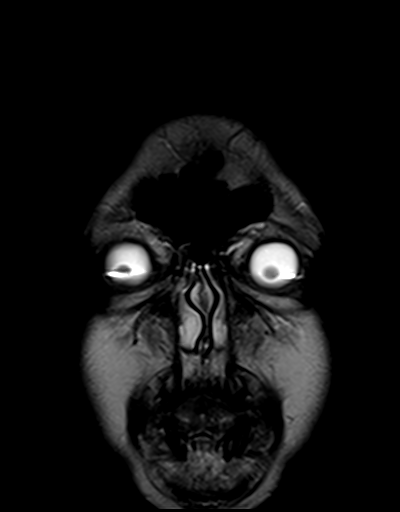

[Series 9: swi_images · axial · 4.0mm · 0.94mm/px · z∈[-52,+102]mm · 4 of 40 slices shown]
[im 1/40]
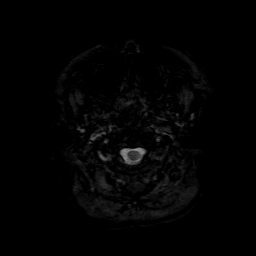
[im 14/40]
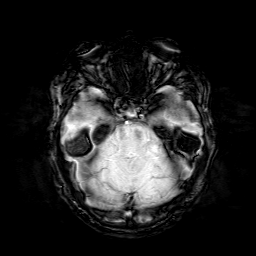
[im 27/40]
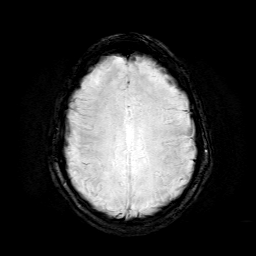
[im 40/40]
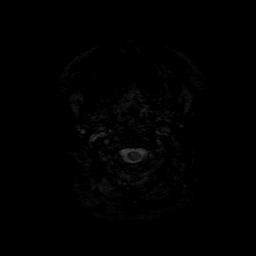

[Series 10: T1 · sagittal · 3.0mm · 0.33mm/px · 1 of 15 slices shown (2 of 3)]
[im 1/15]
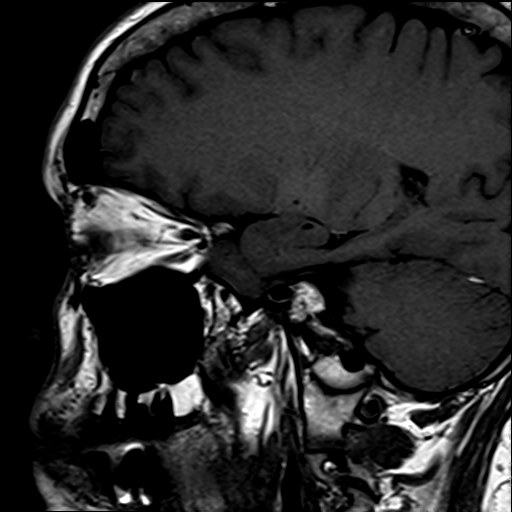

[Series 11: T1 · coronal · 3.0mm · 0.33mm/px · 1 of 14 slices shown (3 of 3)]
[im 1/14]
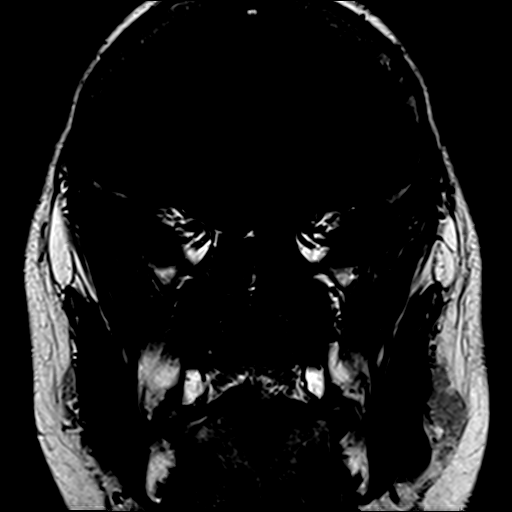

[Series 12: T2 · coronal · 3.0mm · 0.23mm/px · 1 of 14 slices shown (3 of 3)]
[im 1/14]
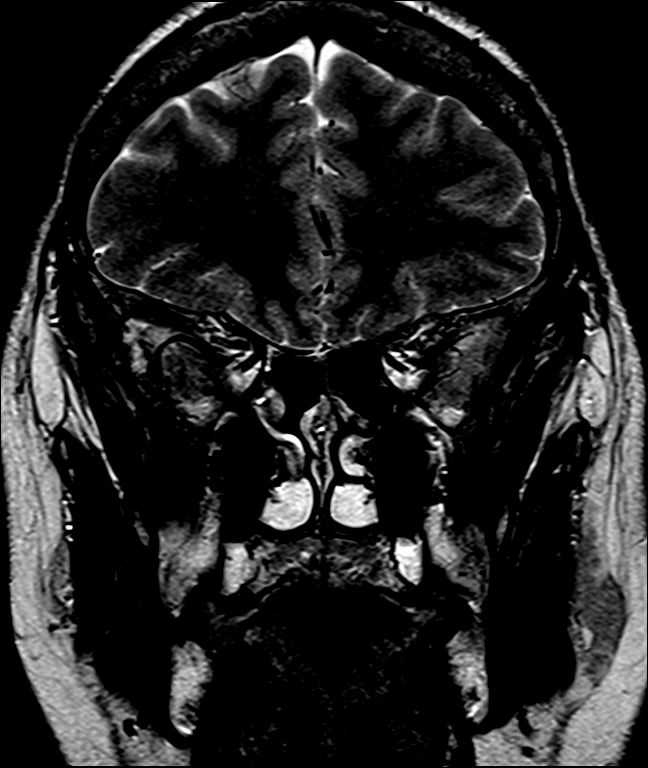

[Series 13: pre cor dynamic · coronal · non-contrast · 3.0mm · 0.35mm/px · 1 of 13 slices shown]
[im 1/13]
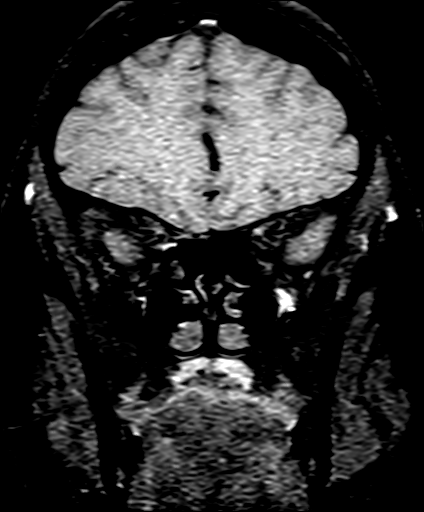

[Series 26: T1 post-contrast · sagittal · 3.0mm · 0.33mm/px · 1 of 15 slices shown (1 of 2)]
[im 1/15]
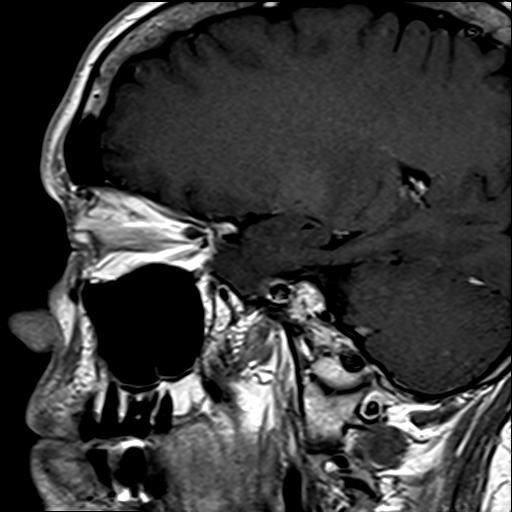

[Series 33: T1 post-contrast · coronal · 3.0mm · 0.33mm/px · 1 of 14 slices shown (2 of 2)]
[im 1/14]
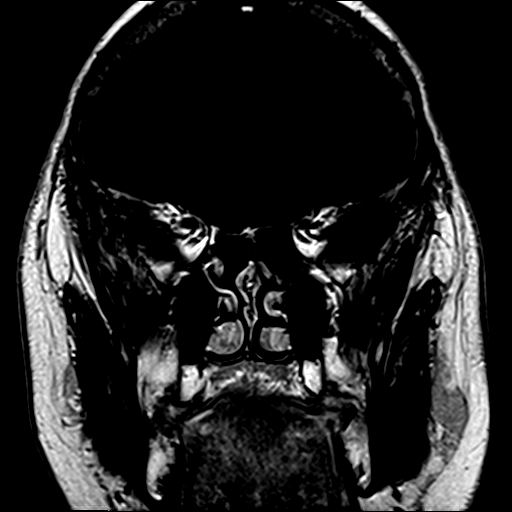

[34 of 48 positions shown; findings below may reference images not displayed]

FINDINGS: Brain: Redemonstrated residual tumor in the left sella, which
measures up to 16 x 7 x 8 mm (AP x TR x CC) (series 26, image 8 and
series 33, image 6), previously 16 x 10 x 8 mm when remeasured
similarly. Unchanged extension into the left cavernous sinus.
Unchanged rightward deviation of the infundibulum. The sella is
expanded. No evidence of compression of the optic chiasm.

No restricted diffusion to suggest acute or subacute infarct. No
acute hemorrhage, mass, mass effect, or midline shift. No
hydrocephalus or extra-axial collection.

Vascular: Normal flow voids.

Skull and upper cervical spine: Normal marrow signal.

Sinuses/Orbits: No acute finding.

Other: The mastoids are well aerated.
IMPRESSION: Stable to slightly decreased size of residual pituitary tumor in the
left sella, with unchanged extension into the left cavernous sinus.
No acute intracranial process.

## 2022-09-12 DIAGNOSIS — D125 Benign neoplasm of sigmoid colon: Secondary | ICD-10-CM | POA: Diagnosis not present

## 2022-09-12 DIAGNOSIS — Z1211 Encounter for screening for malignant neoplasm of colon: Secondary | ICD-10-CM | POA: Diagnosis not present

## 2022-09-12 DIAGNOSIS — K648 Other hemorrhoids: Secondary | ICD-10-CM | POA: Diagnosis not present

## 2022-09-12 DIAGNOSIS — K5730 Diverticulosis of large intestine without perforation or abscess without bleeding: Secondary | ICD-10-CM | POA: Diagnosis not present

## 2022-09-12 DIAGNOSIS — K635 Polyp of colon: Secondary | ICD-10-CM | POA: Diagnosis not present

## 2022-11-20 ENCOUNTER — Ambulatory Visit: Payer: BC Managed Care – PPO | Admitting: Surgical

## 2022-11-20 ENCOUNTER — Ambulatory Visit (INDEPENDENT_AMBULATORY_CARE_PROVIDER_SITE_OTHER): Payer: BC Managed Care – PPO

## 2022-11-20 DIAGNOSIS — M79604 Pain in right leg: Secondary | ICD-10-CM

## 2022-11-22 ENCOUNTER — Encounter: Payer: Self-pay | Admitting: Surgical

## 2022-11-22 NOTE — Progress Notes (Signed)
Follow-up Office Visit Note   Patient: Kim Guerra           Date of Birth: May 31, 1966           MRN: [id number] Visit Date: 11/20/2022 Requested by: Jacelyn Pi, [address] [place],  [place] [address] PCP: Jacelyn Pi, MD  Subjective: Chief Complaint  Patient presents with   Right Leg - Pain    HPI: Kim Guerra is a 57 y.o. female who returns to the office for follow-up visit.    Plan at last visit was:  Impression is low back pain which sounds like it may be a bulging disc centrally or slightly to the left.  Plan is Robaxin and Medrol Dosepak.  Avoid heavy lifting.  Walking for exercise okay.  Stretching encouraged.  If her symptoms worsen based on the recurrent nature of the sciatica I would favor MRI scan to allow for epidural steroid injections to proceed.  Follow-up as needed.   Since then, patient notes right leg pain that she states has been going on for about the last year.  Is been worse since last Friday after she was standing for long period of time on concrete at work.  She describes pain in the low back with right leg pain that travels down the back of her thigh to her calf.  She has an associated burning sensation but no numbness or tingling.  This pain will wake her up from sleep at night.  She also has groin pain that this is bothers her to a lesser degree.  She has increased pain with hip range of motion and it is difficult for her to lift her leg to get into the car at times.  No history of prior Hip or back surgery.  She is taking 3 Advil's in order to get any relief.  She has 2 kids and she works in [employer], utilizing a standing desk when she is able to.  She walks a lot during work but walking seems to make her symptoms worse.  Does not really have any significant left-sided symptoms aside from very occasional flareup of left sciatica.              ROS: All systems reviewed are negative as they relate to the chief complaint within the history of  present illness.  Patient denies fevers or chills.  Assessment & Plan: Visit Diagnoses:  1. Pain in right leg     Plan: [name] is a 57 y.o. female who returns to the office for follow-up visit for right leg pain. .  Plan from last visit was noted above in HPI.  They now return with no significant lasting relief from Medrol Dosepak that was given to her last year.  She has had continued pain over the last year but specifically worse in the last week.  She has radicular pain that travels down to her right calf.  With ongoing low back pain and right leg radicular pain, plan to proceed with MRI of the lumbar spine for further evaluation of right radiculopathy.  She has no weakness on exam but she does have some very slight clonus of the left lower extremity.  She has some degenerative changes of the right hip joint but this does not seem to be the main driver of her right leg pain.  Plan for likely lumbar spine ESI to follow after MRI scan.  She has failed conservative management at this point with no improvement over  the last year despite Medrol Dosepak and other modalities such as stretching and exercises.  She does have some degenerative changes of her right hip joint so that may need to be considered if her MRI of her lumbar spine is underwhelming or if she has no relief from the lumbar spine ESI in the future.  We could consider diagnostic hip joint injection.  Follow-Up Instructions: No follow-ups on file.   Orders:  Orders Placed This Encounter  Procedures   XR Lumbar Spine 2-3 Views   XR HIP UNILAT W OR W/O PELVIS 2-3 VIEWS RIGHT   MR Lumbar Spine w/o contrast   No orders of the defined types were placed in this encounter.     Procedures: No procedures performed   Clinical Data: No additional findings.  Objective: Vital Signs: There were no vitals taken for this visit.  Physical Exam:  Constitutional: Patient appears well-developed HEENT:  Head: Normocephalic Eyes:EOM  are normal Neck: Normal range of motion Cardiovascular: Normal rate Pulmonary/chest: Effort normal Neurologic: Patient is alert Skin: Skin is warm Psychiatric: Patient has normal mood and affect  Ortho Exam: Ortho exam demonstrates positive straight leg raise on right, negative on left.  She has intact hip flexion, quadricep, hamstring, dorsiflexion, plantarflexion rated 5/5.  She has 2+ patellar tendon reflexes bilaterally.  She does have very subtle clonus of the left lower extremity when forcefully dorsiflexing her left ankle.  No such finding on her right side.  Tenderness throughout the axial lumbar spine and paraspinal musculature.  She has no pain with hip range of motion as far as FADIR sign goes.  Does have some pain with Stinchfield sign.  Specialty Comments:  No specialty comments available.  Imaging: No results found.   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/29/2014   Abnormal EKG 11/04/2014   Dyspnea 11/04/2014   Essential hypertension 11/04/2014   Past Medical History:  Diagnosis Date   Anemia    Dysrhythmia    pvc's   Pituitary macroadenoma ([facility])    PONV (postoperative nausea and vomiting)     Family History  Problem Relation Age of Onset   Lung cancer Father 83   Sarcoidosis Sister    Lung cancer Brother 44    Past Surgical History:  Procedure Laterality Date   CRANIOTOMY N/A 12/29/2014   Procedure: CRANIOTOMY HYPOPHYSECTOMY TRANSNASAL APPROACH;  Surgeon: Jovita Gamma, MD;  Location: [facility];  Service: Neurosurgery;  Laterality: N/A;  Transphenoidal resection of pituitary tumor harvesting of abdominal fat graft   ECTOPIC PREGNANCY SURGERY     TRANSNASAL APPROACH N/A 12/29/2014   Procedure: TRANSPHENOIDAL PITUITARY RESECTION WITH IMAGE GUIDANCE;  Surgeon: Leta Baptist, MD;  Location: [facility];  Service: ENT;  Laterality: N/A;   WISDOM TOOTH EXTRACTION     Social  History   Occupational History   Not on file  Tobacco Use   Smoking status: Never   Smokeless tobacco: Never  Substance and Sexual Activity   Alcohol use: No    Alcohol/week: 0.0 standard drinks of alcohol   Drug use: No   Sexual activity: Not on file

## 2022-11-28 ENCOUNTER — Ambulatory Visit
Admission: RE | Admit: 2022-11-28 | Discharge: 2022-11-28 | Disposition: A | Discharge status: Home / Self Care | Payer: BC Managed Care – PPO | Source: Ambulatory Visit | Attending: Surgical | Admitting: Surgical

## 2022-11-28 DIAGNOSIS — M79604 Pain in right leg: Secondary | ICD-10-CM

## 2022-12-03 ENCOUNTER — Ambulatory Visit: Payer: BC Managed Care – PPO | Admitting: Surgical

## 2022-12-03 ENCOUNTER — Encounter: Payer: Self-pay | Admitting: Surgical

## 2022-12-03 DIAGNOSIS — M79604 Pain in right leg: Secondary | ICD-10-CM

## 2022-12-03 NOTE — Progress Notes (Signed)
Office Visit Note   Patient: Kim Guerra           Date of Birth: [birth year]           MRN: [id number] Visit Date: 12/03/2022 Requested by: Jacelyn Pi, [address] [place],  [place] [address] PCP: Jacelyn Pi, MD  Subjective: Chief Complaint  Patient presents with   Lower Back - Follow-up, Pain    MRI review    HPI: Kim Guerra is a 57 y.o. female who presents to the office for MRI review. Patient denies any changes in symptoms.  Continues to complain mainly of right-sided low back pain with radiating pain that travels down the posterior lateral aspect of the right leg down to about mid calf.  She describes a soreness and aching sensation with not much burning pain.  She has difficulty finding a comfortable position at night.  Pain has somewhat lightened up since her last visit several weeks ago but she still has fairly prominent pain that has been ongoing for the last year.  Most of her pain is in the buttock and SI joint region.  She also does note some groin pain with radiating pain down from the groin.  MRI results revealed: MR Lumbar Spine w/o contrast  Result Date: 12/01/2022 CLINICAL DATA:  Low back pain extending to the right buttock and right leg for 1 year. No known injury. EXAM: MRI LUMBAR SPINE WITHOUT CONTRAST TECHNIQUE: Multiplanar, multisequence MR imaging of the lumbar spine was performed. No intravenous contrast was administered. COMPARISON:  None Available. FINDINGS: Segmentation:  Standard. Alignment:  Physiologic. Vertebrae: No acute fracture, evidence of discitis, or aggressive bone lesion. Conus medullaris and cauda equina: Conus extends to the L1-2 level. Conus and cauda equina appear normal. Paraspinal and other soft tissues: No acute paraspinal abnormality. Disc levels: Disc spaces: Disc desiccation at L1-2 and L5-S1. T12-L1: No significant disc bulge. No neural foraminal stenosis. No central canal stenosis. L1-L2: Mild broad-based disc  bulge. No foraminal or central canal stenosis. L2-L3: No significant disc bulge. No neural foraminal stenosis. No central canal stenosis. L3-L4: No significant disc bulge. No neural foraminal stenosis. No central canal stenosis. L4-L5: No significant disc bulge. No neural foraminal stenosis. No central canal stenosis. L5-S1: No significant disc bulge. Small central annular fissure. No neural foraminal stenosis. No central canal stenosis. Mild bilateral facet arthropathy. IMPRESSION: 1. No significant lumbar spine disc protrusion, foraminal stenosis or central canal stenosis. 2. No acute osseous injury of the lumbar spine. Electronically Signed   By: Kathreen Devoid M.D.   On: 12/01/2022 08:32                 ROS: All systems reviewed are negative as they relate to the chief complaint within the history of present illness.  Patient denies fevers or chills.  Assessment & Plan: Visit Diagnoses:  1. Pain in right leg     Plan: Kim Guerra is a 57 y.o. female who presents to the office for evaluation of lumbar spine MRI as well as evaluation of radiating right leg pain.  She has had right leg pain radiating down that has been making sleeping difficult for her over the last year.  She has MRI that is reviewed today demonstrating really no significant compressive stenosis or any focal finding to explain her continued severe pain.  She has excellent lower extremity strength on exam today with no evidence of clonus or hyper reflexia.  She does have some groin pain that  is reliably reproduced with Stinchfield sign and hip range of motion which may contribute some to her right leg pain.  However, most interestingly, she does have some reproduction of pretty much the entire distribution of her pain with palpation over the SI joint where she localizes a lot of her primary low back region pain.  We discussed the options available to patient today which included living with her symptoms versus diagnostic/therapeutic right  hip joint injection versus diagnostic/therapeutic SI joint injection versus lumbar spine ESI versus MRI of the thoracic spine for further evaluation of other nerve compression that may contribute to some leg pain.  She has no family history of neurologic disorder or any other neurologic symptoms that she can recall with no radicular pain/numbness/tingling in any other extremities and no change in her vision/new headaches.  After discussion of various options, honestly think that she would benefit most from trying ultrasound-guided right SI joint injection first given the reproduction of her symptoms by palpation in this region.  She also has some degenerative changes that is noted in the right SI joint based on her pelvic radiographs from last visit.  She will consider her options and reach out to the office with what she decides.  Follow-Up Instructions: No follow-ups on file.   Orders:  No orders of the defined types were placed in this encounter.  No orders of the defined types were placed in this encounter.     Procedures: No procedures performed   Clinical Data: No additional findings.  Objective: Vital Signs: There were no vitals taken for this visit.  Physical Exam:  Constitutional: Patient appears well-developed HEENT:  Head: Normocephalic Eyes:EOM are normal Neck: Normal range of motion Cardiovascular: Normal rate Pulmonary/chest: Effort normal Neurologic: Patient is alert Skin: Skin is warm Psychiatric: Patient has normal mood and affect  Ortho Exam: Ortho exam demonstrates mild pain with FADIR sign reproducing groin pain and some anterior thigh pain.  She has negative straight leg raise with positive Stinchfield sign reproducing her groin pain and some of her thigh pain again.  She has excellent hip flexion strength rated 5/5.  Excellent quad, hamstring, dorsiflexion, plantarflexion strength rated 5/5.  She has no clonus bilaterally.  She has equivalent patellar tendon  reflexes bilaterally as well as Achilles tendon reflexes with no abnormalities.  Does not really have significant tenderness throughout the axial lumbar spine but she does have some tenderness over the right SI joint.  Constant palpation in the right SI joint does actually reproduce some of her radiating pain with her feeling a shooting pain down into her calf like she describes.  Specialty Comments:  No specialty comments available.  Imaging: No results found.   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/29/2014   Abnormal EKG 11/04/2014   Dyspnea 11/04/2014   Essential hypertension 11/04/2014   Past Medical History:  Diagnosis Date   Anemia    Dysrhythmia    pvc's   Pituitary macroadenoma ([facility])    PONV (postoperative nausea and vomiting)     Family History  Problem Relation Age of Onset   Lung cancer Father 15   Sarcoidosis Sister    Lung cancer Brother 60    Past Surgical History:  Procedure Laterality Date   CRANIOTOMY N/A 12/29/2014   Procedure: CRANIOTOMY HYPOPHYSECTOMY TRANSNASAL APPROACH;  Surgeon: Jovita Gamma, MD;  Location: [facility];  Service: Neurosurgery;  Laterality: N/A;  Transphenoidal resection of  pituitary tumor harvesting of abdominal fat graft   ECTOPIC PREGNANCY SURGERY     TRANSNASAL APPROACH N/A 12/29/2014   Procedure: TRANSPHENOIDAL PITUITARY RESECTION WITH IMAGE GUIDANCE;  Surgeon: Leta Baptist, MD;  Location: [facility];  Service: ENT;  Laterality: N/A;   WISDOM TOOTH EXTRACTION     Social History   Occupational History   Not on file  Tobacco Use   Smoking status: Never   Smokeless tobacco: Never  Substance and Sexual Activity   Alcohol use: No    Alcohol/week: 0.0 standard drinks of alcohol   Drug use: No   Sexual activity: Not on file

## 2022-12-05 ENCOUNTER — Other Ambulatory Visit: Payer: Self-pay

## 2022-12-05 DIAGNOSIS — M79604 Pain in right leg: Secondary | ICD-10-CM

## 2022-12-19 ENCOUNTER — Other Ambulatory Visit: Payer: Self-pay

## 2022-12-19 ENCOUNTER — Ambulatory Visit: Payer: BC Managed Care – PPO | Admitting: Physical Medicine and Rehabilitation

## 2022-12-19 DIAGNOSIS — M461 Sacroiliitis, not elsewhere classified: Secondary | ICD-10-CM | POA: Diagnosis not present

## 2022-12-19 NOTE — Progress Notes (Signed)
Kim Guerra - 57 y.o. female MRN [id number]  Date of birth: 03-24-1966  Office Visit Note: Visit Date: 12/19/2022 PCP: Dorisann Frames, MD Referred by: Dorisann Frames, MD  Subjective: Chief Complaint  Patient presents with   Lower Back - Pain   HPI:  Kim Guerra is a 57 y.o. female who comes in today at the request of Karenann Cai, PA-C for planned Right anesthetic Sacroiliac joint arthrogram with fluoroscopic guidance.  The patient has failed conservative care including home exercise, medications, time and activity modification.  This injection will be diagnostic and hopefully therapeutic.  Please see requesting physician notes for further details and justification.  Consider regrouping with physical therapy for manual manipulation of the sacroiliac joint versus trigger point myofascial release and dry needling.   Positive Fortin finger sign, Patrick's testing.   ROS Otherwise per HPI.  Assessment & Plan: Visit Diagnoses:    ICD-10-CM   1. Sacroiliitis  M46.1 Sacroiliac Joint Inj    XR C-ARM NO REPORT      Plan: No additional findings.   Meds & Orders: No orders of the defined types were placed in this encounter.   Orders Placed This Encounter  Procedures   Sacroiliac Joint Inj   XR C-ARM NO REPORT    Follow-up: Return for visit to requesting provider as needed.   Procedures: Sacroiliac Joint Inj on 12/19/2022 8:19 AM Indications: pain and diagnostic evaluation Details: 22 G 3.5 in needle, fluoroscopy-guided posterior approach Medications: 2 mL bupivacaine 0.5 %; 80 mg methylPREDNISolone acetate 80 MG/ML; 40 mg methylPREDNISolone acetate 40 MG/ML Outcome: tolerated well, no immediate complications  Sacroiliac Joint Intra-Articular Injection - Posterior Approach with Fluoroscopic Guidance   Position: PRONE  Additional Comments: Vital signs were monitored before and after the procedure. Patient was prepped and draped in the usual sterile fashion. The correct  patient, procedure, and site was verified.   Injection Procedure Details:   Location/Site:  Sacroiliac joint  Needle size: 3.5 in Spinal Needle  Needle type: Spinal  Needle Placement: Intra-articular  Findings:  -Comments: There was excellent flow of contrast producing a partial arthrogram of the sacroiliac joint.   Procedure Details: Starting with a 90 degree vertical and midline orientation the fluoroscope was tilted cranially 20 to 25 degrees and the target area of the inferior most part of the SI joint on the side mentioned above was visualized.  The soft tissues overlying this target were infiltrated with 4 ml. of 1% Lidocaine without Epinephrine. A #22 gauge spinal needle was inserted perpendicular to the fluoroscope table and advanced into the posterior inferior joint space using fluoroscopic guidance.  Position in the joint space was confirmed by obtaining a partial arthrogram using a 2 ml. volume of Isovue-250 contrast agent. After negative aspirate for gross pus or blood, the injectate was delivered to the joint. Radiographs were obtained for documentation purposes.   Additional Comments:   Dressing: Bandaid    Post-procedure details: Patient was observed during the procedure. Post-procedure instructions were reviewed.  Patient left the clinic in stable condition.    There was excellent flow of contrast producing a partial arthrogram of the sacroiliac joint.  Procedure, treatment alternatives, risks and benefits explained, specific risks discussed. Consent was given by the patient. Immediately prior to procedure a time out was called to verify the correct patient, procedure, equipment, support staff and site/side marked as required. Patient was prepped and draped in the usual sterile fashion.          Clinical History:  MRI LUMBAR SPINE WITHOUT CONTRAST   TECHNIQUE: Multiplanar, multisequence MR imaging of the lumbar spine was performed. No intravenous contrast  was administered.   COMPARISON:  None Available.   FINDINGS: Segmentation:  Standard.   Alignment:  Physiologic.   Vertebrae: No acute fracture, evidence of discitis, or aggressive bone lesion.   Conus medullaris and cauda equina: Conus extends to the L1-2 level. Conus and cauda equina appear normal.   Paraspinal and other soft tissues: No acute paraspinal abnormality.   Disc levels:   Disc spaces: Disc desiccation at L1-2 and L5-S1.   T12-L1: No significant disc bulge. No neural foraminal stenosis. No central canal stenosis.   L1-L2: Mild broad-based disc bulge. No foraminal or central canal stenosis.   L2-L3: No significant disc bulge. No neural foraminal stenosis. No central canal stenosis.   L3-L4: No significant disc bulge. No neural foraminal stenosis. No central canal stenosis.   L4-L5: No significant disc bulge. No neural foraminal stenosis. No central canal stenosis.   L5-S1: No significant disc bulge. Small central annular fissure. No neural foraminal stenosis. No central canal stenosis. Mild bilateral facet arthropathy.   IMPRESSION: 1. No significant lumbar spine disc protrusion, foraminal stenosis or central canal stenosis. 2. No acute osseous injury of the lumbar spine.     Electronically Signed   By: Elige Ko M.D.   On: 12/01/2022 08:32     Objective:  VS:  HT:    WT:   BMI:     BP:   HR: bpm  TEMP: ( )  RESP:  Physical Exam   Imaging: No results found.

## 2022-12-19 NOTE — Progress Notes (Signed)
Functional Pain Scale - descriptive words and definitions  Distracting (5)    Aware of pain/able to complete some ADL's but limited by pain/sleep is affected and active distractions are only slightly useful. Moderate range order  Average Pain  varies   +Driver, -BT, -Dye Allergies.  Lower back pain that radiates into right leg. Pain is worse at night. Advil to relieve pain

## 2022-12-21 MED ORDER — BUPIVACAINE HCL 0.5 % IJ SOLN
2.0000 mL | INTRAMUSCULAR | Status: AC | PRN
  Administered 2022-12-19: 2 mL via INTRA_ARTICULAR

## 2022-12-21 MED ORDER — METHYLPREDNISOLONE ACETATE 40 MG/ML IJ SUSP
40.0000 mg | INTRAMUSCULAR | Status: AC | PRN
  Administered 2022-12-19: 40 mg via INTRA_ARTICULAR

## 2022-12-21 MED ORDER — METHYLPREDNISOLONE ACETATE 80 MG/ML IJ SUSP
80.0000 mg | INTRAMUSCULAR | Status: AC | PRN
  Administered 2022-12-19: 80 mg via INTRA_ARTICULAR

## 2023-01-14 DIAGNOSIS — Z1231 Encounter for screening mammogram for malignant neoplasm of breast: Secondary | ICD-10-CM | POA: Diagnosis not present

## 2023-01-14 DIAGNOSIS — Z13820 Encounter for screening for osteoporosis: Secondary | ICD-10-CM | POA: Diagnosis not present

## 2023-01-15 DIAGNOSIS — Z124 Encounter for screening for malignant neoplasm of cervix: Secondary | ICD-10-CM | POA: Diagnosis not present

## 2023-01-15 DIAGNOSIS — Z01419 Encounter for gynecological examination (general) (routine) without abnormal findings: Secondary | ICD-10-CM | POA: Diagnosis not present

## 2023-01-15 DIAGNOSIS — Z6832 Body mass index (BMI) 32.0-32.9, adult: Secondary | ICD-10-CM | POA: Diagnosis not present

## 2023-03-11 DIAGNOSIS — D443 Neoplasm of uncertain behavior of pituitary gland: Secondary | ICD-10-CM | POA: Diagnosis not present

## 2023-03-11 DIAGNOSIS — E220 Acromegaly and pituitary gigantism: Secondary | ICD-10-CM | POA: Diagnosis not present

## 2023-03-11 DIAGNOSIS — E221 Hyperprolactinemia: Secondary | ICD-10-CM | POA: Diagnosis not present

## 2023-03-11 DIAGNOSIS — E559 Vitamin D deficiency, unspecified: Secondary | ICD-10-CM | POA: Diagnosis not present

## 2023-03-24 DIAGNOSIS — I10 Essential (primary) hypertension: Secondary | ICD-10-CM | POA: Diagnosis not present

## 2023-03-24 DIAGNOSIS — D443 Neoplasm of uncertain behavior of pituitary gland: Secondary | ICD-10-CM | POA: Diagnosis not present

## 2023-03-24 DIAGNOSIS — E785 Hyperlipidemia, unspecified: Secondary | ICD-10-CM | POA: Diagnosis not present

## 2023-03-24 DIAGNOSIS — E220 Acromegaly and pituitary gigantism: Secondary | ICD-10-CM | POA: Diagnosis not present

## 2023-09-05 ENCOUNTER — Encounter: Payer: Self-pay | Admitting: Physical Medicine and Rehabilitation

## 2023-09-05 DIAGNOSIS — M461 Sacroiliitis, not elsewhere classified: Secondary | ICD-10-CM

## 2023-10-06 ENCOUNTER — Other Ambulatory Visit: Payer: Self-pay

## 2023-10-06 ENCOUNTER — Ambulatory Visit: Payer: BC Managed Care – PPO | Admitting: Physical Medicine and Rehabilitation

## 2023-10-06 DIAGNOSIS — M461 Sacroiliitis, not elsewhere classified: Secondary | ICD-10-CM | POA: Diagnosis not present

## 2023-10-06 NOTE — Progress Notes (Signed)
Functional Pain Scale - descriptive words and definitions  Mild (2)   Noticeable when not distracted/no impact on ADL's/sleep only slightly affected and able to   use both passive and active distraction for comfort. Mild range order  Average Pain 1 The last 2 weeks the pain has been very minimal maybe about a 1.    +Driver, -BT, -Dye Allergies.

## 2023-10-06 NOTE — Progress Notes (Signed)
Kim Guerra - 58 y.o. female MRN [id number]  Date of birth: 05-15-1966  Office Visit Note: Visit Date: 10/06/2023 PCP: Dorisann Frames, MD Referred by: Dorisann Frames, MD  Subjective: Chief Complaint  Patient presents with   Lower Back - Pain   HPI:  Kim Guerra is a 58 y.o. female who comes in today at the request of Karenann Cai, PA-C for planned Right anesthetic Sacroiliac joint arthrogram with fluoroscopic guidance.  The patient has failed conservative care including home exercise, medications, time and activity modification.  This injection will be diagnostic and hopefully therapeutic.  Please see requesting physician notes for further details and justification.  Prior injection was in April and she got really outstanding relief up until just recently.  No new trauma etc.  Positive Fortin finger sign, Patrick's testing and lateral compression test.    ROS Otherwise per HPI.  Assessment & Plan: Visit Diagnoses:    ICD-10-CM   1. Sacroiliitis (HCC)  M46.1 XR C-ARM NO REPORT    Right Sacroiliac Joint Inj      Plan: No additional findings.   Meds & Orders: No orders of the defined types were placed in this encounter.   Orders Placed This Encounter  Procedures   Right Sacroiliac Joint Inj   XR C-ARM NO REPORT    Follow-up: Return if symptoms worsen or fail to improve.   Procedures: Right Sacroiliac Joint Inj on 10/06/2023 2:36 PM Indications: pain and diagnostic evaluation Details: 22 G 3.5 in needle, fluoroscopy-guided posterior approach Medications: 2 mL bupivacaine 0.5 %; 40 mg methylPREDNISolone acetate 40 MG/ML Outcome: tolerated well, no immediate complications  Sacroiliac Joint Intra-Articular Injection - Posterior Approach with Fluoroscopic Guidance   Position: PRONE  Additional Comments: Vital signs were monitored before and after the procedure. Patient was prepped and draped in the usual sterile fashion. The correct patient, procedure, and site was  verified.   Injection Procedure Details:   Location/Site:  Sacroiliac joint, Right  Needle size: 3.5 in Spinal Needle  Needle type: Spinal  Needle Placement: Intra-articular  Findings:  -Comments: There was excellent flow of contrast producing a partial arthrogram of the sacroiliac joint.   Procedure Details: Starting with a 90 degree vertical and midline orientation the fluoroscope was tilted cranially 20 to 25 degrees and the target area of the inferior most part of the SI joint on the side mentioned above was visualized.  The soft tissues overlying this target were infiltrated with 4 ml. of 1% Lidocaine without Epinephrine. A #22 gauge spinal needle was inserted perpendicular to the fluoroscope table and advanced into the posterior inferior joint space using fluoroscopic guidance.  Position in the joint space was confirmed by obtaining a partial arthrogram using a 2 ml. volume of Isovue-250 contrast agent. After negative aspirate for gross pus or blood, the injectate was delivered to the joint. Radiographs were obtained for documentation purposes.   Additional Comments:   Dressing: Bandaid    Post-procedure details: Patient was observed during the procedure. Post-procedure instructions were reviewed.  Patient left the clinic in stable condition.    There was excellent flow of contrast producing a partial arthrogram of the sacroiliac joint.  Procedure, treatment alternatives, risks and benefits explained, specific risks discussed. Consent was given by the patient. Immediately prior to procedure a time out was called to verify the correct patient, procedure, equipment, support staff and site/side marked as required. Patient was prepped and draped in the usual sterile fashion.  Clinical History: MRI LUMBAR SPINE WITHOUT CONTRAST   TECHNIQUE: Multiplanar, multisequence MR imaging of the lumbar spine was performed. No intravenous contrast was administered.    COMPARISON:  None Available.   FINDINGS: Segmentation:  Standard.   Alignment:  Physiologic.   Vertebrae: No acute fracture, evidence of discitis, or aggressive bone lesion.   Conus medullaris and cauda equina: Conus extends to the L1-2 level. Conus and cauda equina appear normal.   Paraspinal and other soft tissues: No acute paraspinal abnormality.   Disc levels:   Disc spaces: Disc desiccation at L1-2 and L5-S1.   T12-L1: No significant disc bulge. No neural foraminal stenosis. No central canal stenosis.   L1-L2: Mild broad-based disc bulge. No foraminal or central canal stenosis.   L2-L3: No significant disc bulge. No neural foraminal stenosis. No central canal stenosis.   L3-L4: No significant disc bulge. No neural foraminal stenosis. No central canal stenosis.   L4-L5: No significant disc bulge. No neural foraminal stenosis. No central canal stenosis.   L5-S1: No significant disc bulge. Small central annular fissure. No neural foraminal stenosis. No central canal stenosis. Mild bilateral facet arthropathy.   IMPRESSION: 1. No significant lumbar spine disc protrusion, foraminal stenosis or central canal stenosis. 2. No acute osseous injury of the lumbar spine.     Electronically Signed   By: Elige Ko M.D.   On: 12/01/2022 08:32     Objective:  VS:  HT:    WT:   BMI:     BP:   HR: bpm  TEMP: ( )  RESP:  Physical Exam   Imaging: No results found.

## 2023-10-15 MED ORDER — METHYLPREDNISOLONE ACETATE 40 MG/ML IJ SUSP
40.0000 mg | INTRAMUSCULAR | Status: AC | PRN
  Administered 2023-10-06: 40 mg via INTRA_ARTICULAR

## 2023-10-15 MED ORDER — BUPIVACAINE HCL 0.5 % IJ SOLN
2.0000 mL | INTRAMUSCULAR | Status: AC | PRN
  Administered 2023-10-06: 2 mL via INTRA_ARTICULAR

## 2024-02-11 DIAGNOSIS — H6123 Impacted cerumen, bilateral: Secondary | ICD-10-CM | POA: Diagnosis not present

## 2024-02-11 DIAGNOSIS — J305 Allergic rhinitis due to food: Secondary | ICD-10-CM | POA: Diagnosis not present

## 2024-02-11 DIAGNOSIS — J302 Other seasonal allergic rhinitis: Secondary | ICD-10-CM | POA: Diagnosis not present

## 2024-03-23 DIAGNOSIS — E221 Hyperprolactinemia: Secondary | ICD-10-CM | POA: Diagnosis not present

## 2024-03-23 DIAGNOSIS — E559 Vitamin D deficiency, unspecified: Secondary | ICD-10-CM | POA: Diagnosis not present

## 2024-03-23 DIAGNOSIS — D443 Neoplasm of uncertain behavior of pituitary gland: Secondary | ICD-10-CM | POA: Diagnosis not present

## 2024-03-23 DIAGNOSIS — E220 Acromegaly and pituitary gigantism: Secondary | ICD-10-CM | POA: Diagnosis not present

## 2024-03-26 DIAGNOSIS — Z6832 Body mass index (BMI) 32.0-32.9, adult: Secondary | ICD-10-CM | POA: Diagnosis not present

## 2024-03-26 DIAGNOSIS — Z01419 Encounter for gynecological examination (general) (routine) without abnormal findings: Secondary | ICD-10-CM | POA: Diagnosis not present

## 2024-03-26 DIAGNOSIS — Z124 Encounter for screening for malignant neoplasm of cervix: Secondary | ICD-10-CM | POA: Diagnosis not present

## 2024-03-30 ENCOUNTER — Other Ambulatory Visit (HOSPITAL_COMMUNITY): Payer: Self-pay | Admitting: Endocrinology

## 2024-03-30 DIAGNOSIS — E785 Hyperlipidemia, unspecified: Secondary | ICD-10-CM

## 2024-03-30 DIAGNOSIS — I10 Essential (primary) hypertension: Secondary | ICD-10-CM | POA: Diagnosis not present

## 2024-03-30 DIAGNOSIS — D443 Neoplasm of uncertain behavior of pituitary gland: Secondary | ICD-10-CM | POA: Diagnosis not present

## 2024-03-30 DIAGNOSIS — E220 Acromegaly and pituitary gigantism: Secondary | ICD-10-CM | POA: Diagnosis not present

## 2024-04-07 ENCOUNTER — Ambulatory Visit
Admission: RE | Admit: 2024-04-07 | Discharge: 2024-04-07 | Disposition: A | Discharge status: Home / Self Care | Payer: Self-pay | Source: Ambulatory Visit | Attending: Endocrinology | Admitting: Endocrinology

## 2024-04-07 DIAGNOSIS — E785 Hyperlipidemia, unspecified: Secondary | ICD-10-CM | POA: Insufficient documentation

## 2024-04-22 ENCOUNTER — Other Ambulatory Visit (HOSPITAL_COMMUNITY): Payer: Self-pay

## 2024-04-23 ENCOUNTER — Other Ambulatory Visit (HOSPITAL_COMMUNITY): Payer: Self-pay

## 2024-04-23 MED ORDER — AMLODIPINE BESYLATE 5 MG PO TABS
5.0000 mg | ORAL_TABLET | Freq: Every day | ORAL | 3 refills | Status: AC
  Filled 2024-05-16: qty 90, 90d supply, fill #0
  Filled 2024-08-17: qty 90, 90d supply, fill #1

## 2024-05-16 ENCOUNTER — Other Ambulatory Visit: Payer: Self-pay

## 2024-05-21 ENCOUNTER — Other Ambulatory Visit (HOSPITAL_COMMUNITY): Payer: Self-pay

## 2024-05-24 ENCOUNTER — Other Ambulatory Visit (HOSPITAL_COMMUNITY): Payer: Self-pay

## 2024-06-07 DIAGNOSIS — M62838 Other muscle spasm: Secondary | ICD-10-CM | POA: Diagnosis not present

## 2024-06-07 DIAGNOSIS — M9904 Segmental and somatic dysfunction of sacral region: Secondary | ICD-10-CM | POA: Diagnosis not present

## 2024-06-07 DIAGNOSIS — M9905 Segmental and somatic dysfunction of pelvic region: Secondary | ICD-10-CM | POA: Diagnosis not present

## 2024-06-07 DIAGNOSIS — M9903 Segmental and somatic dysfunction of lumbar region: Secondary | ICD-10-CM | POA: Diagnosis not present

## 2024-06-09 DIAGNOSIS — M9905 Segmental and somatic dysfunction of pelvic region: Secondary | ICD-10-CM | POA: Diagnosis not present

## 2024-06-09 DIAGNOSIS — M9904 Segmental and somatic dysfunction of sacral region: Secondary | ICD-10-CM | POA: Diagnosis not present

## 2024-06-09 DIAGNOSIS — M9903 Segmental and somatic dysfunction of lumbar region: Secondary | ICD-10-CM | POA: Diagnosis not present

## 2024-06-09 DIAGNOSIS — M62838 Other muscle spasm: Secondary | ICD-10-CM | POA: Diagnosis not present

## 2024-06-17 DIAGNOSIS — M9904 Segmental and somatic dysfunction of sacral region: Secondary | ICD-10-CM | POA: Diagnosis not present

## 2024-06-17 DIAGNOSIS — M9905 Segmental and somatic dysfunction of pelvic region: Secondary | ICD-10-CM | POA: Diagnosis not present

## 2024-06-17 DIAGNOSIS — M62838 Other muscle spasm: Secondary | ICD-10-CM | POA: Diagnosis not present

## 2024-07-19 ENCOUNTER — Encounter: Payer: Self-pay | Admitting: Radiology

## 2024-08-17 ENCOUNTER — Other Ambulatory Visit: Payer: Self-pay

## 2024-10-19 ENCOUNTER — Other Ambulatory Visit: Payer: Self-pay | Admitting: Neurosurgery

## 2024-10-19 DIAGNOSIS — D497 Neoplasm of unspecified behavior of endocrine glands and other parts of nervous system: Secondary | ICD-10-CM

## 2024-10-21 ENCOUNTER — Encounter: Payer: Self-pay | Admitting: Neurosurgery

## 2024-11-25 ENCOUNTER — Other Ambulatory Visit
